# Patient Record
Sex: Female | Born: 1980 | Race: White | Hispanic: No | Marital: Married | Smoking: Never smoker
Health system: Southern US, Community
[De-identification: ages and names within clinical notes are randomized; demographics above are authoritative.]

## PROBLEM LIST (undated history)

## (undated) DIAGNOSIS — Z309 Encounter for contraceptive management, unspecified: Secondary | ICD-10-CM

## (undated) DIAGNOSIS — F99 Mental disorder, not otherwise specified: Secondary | ICD-10-CM

## (undated) HISTORY — PX: APPENDECTOMY: SHX54

## (undated) HISTORY — DX: Mental disorder, not otherwise specified: F99

## (undated) HISTORY — DX: Encounter for contraceptive management, unspecified: Z30.9

---

## 1999-03-27 ENCOUNTER — Encounter: Admission: RE | Admit: 1999-03-27 | Discharge: 1999-06-25 | Payer: Self-pay

## 2001-11-13 ENCOUNTER — Ambulatory Visit (HOSPITAL_COMMUNITY): Admission: RE | Admit: 2001-11-13 | Discharge: 2001-11-13 | Payer: Self-pay | Admitting: *Deleted

## 2002-01-15 ENCOUNTER — Encounter: Payer: Self-pay | Admitting: Pediatrics

## 2002-01-15 ENCOUNTER — Ambulatory Visit (HOSPITAL_COMMUNITY): Admission: RE | Admit: 2002-01-15 | Discharge: 2002-01-15 | Payer: Self-pay | Admitting: Pediatrics

## 2002-04-15 ENCOUNTER — Other Ambulatory Visit: Admission: RE | Admit: 2002-04-15 | Discharge: 2002-04-15 | Payer: Self-pay | Admitting: *Deleted

## 2002-10-18 ENCOUNTER — Other Ambulatory Visit: Admission: RE | Admit: 2002-10-18 | Discharge: 2002-10-18 | Payer: Self-pay | Admitting: *Deleted

## 2003-03-02 ENCOUNTER — Other Ambulatory Visit: Admission: RE | Admit: 2003-03-02 | Discharge: 2003-03-02 | Payer: Self-pay | Admitting: *Deleted

## 2003-09-22 ENCOUNTER — Other Ambulatory Visit: Admission: RE | Admit: 2003-09-22 | Discharge: 2003-09-22 | Payer: Self-pay | Admitting: Obstetrics and Gynecology

## 2004-06-24 ENCOUNTER — Emergency Department (HOSPITAL_COMMUNITY): Admission: EM | Admit: 2004-06-24 | Discharge: 2004-06-24 | Payer: Self-pay | Admitting: Emergency Medicine

## 2004-06-25 ENCOUNTER — Ambulatory Visit: Payer: Self-pay | Admitting: Psychiatry

## 2004-06-25 ENCOUNTER — Inpatient Hospital Stay (HOSPITAL_COMMUNITY): Admission: RE | Admit: 2004-06-25 | Discharge: 2004-06-26 | Payer: Self-pay | Admitting: Psychiatry

## 2005-11-30 ENCOUNTER — Inpatient Hospital Stay (HOSPITAL_COMMUNITY): Admission: AD | Admit: 2005-11-30 | Discharge: 2005-12-03 | Payer: Self-pay | Admitting: *Deleted

## 2005-12-01 ENCOUNTER — Encounter (INDEPENDENT_AMBULATORY_CARE_PROVIDER_SITE_OTHER): Payer: Self-pay | Admitting: Specialist

## 2008-03-02 ENCOUNTER — Other Ambulatory Visit: Admission: RE | Admit: 2008-03-02 | Discharge: 2008-03-02 | Payer: Self-pay | Admitting: Obstetrics and Gynecology

## 2008-04-12 ENCOUNTER — Ambulatory Visit (HOSPITAL_COMMUNITY): Admission: RE | Admit: 2008-04-12 | Discharge: 2008-04-12 | Payer: Self-pay | Admitting: Obstetrics & Gynecology

## 2008-04-28 ENCOUNTER — Ambulatory Visit (HOSPITAL_COMMUNITY): Admission: RE | Admit: 2008-04-28 | Discharge: 2008-04-28 | Payer: Self-pay | Admitting: Obstetrics & Gynecology

## 2008-06-30 ENCOUNTER — Inpatient Hospital Stay (HOSPITAL_COMMUNITY): Admission: AD | Admit: 2008-06-30 | Discharge: 2008-07-02 | Payer: Self-pay | Admitting: Obstetrics & Gynecology

## 2008-07-01 ENCOUNTER — Encounter (INDEPENDENT_AMBULATORY_CARE_PROVIDER_SITE_OTHER): Payer: Self-pay | Admitting: General Surgery

## 2008-09-28 ENCOUNTER — Inpatient Hospital Stay (HOSPITAL_COMMUNITY): Admission: RE | Admit: 2008-09-28 | Discharge: 2008-09-30 | Payer: Self-pay | Admitting: Obstetrics and Gynecology

## 2009-05-25 ENCOUNTER — Other Ambulatory Visit: Admission: RE | Admit: 2009-05-25 | Discharge: 2009-05-25 | Payer: Self-pay | Admitting: Obstetrics and Gynecology

## 2009-08-09 IMAGING — CT CT PELVIS W/ CM
3 of 4 series · 14 of 32 positions shown, 19 images · IV contrast (agent unspecified)
Comparison: Ultrasound same date.

CT ABDOMEN

CLINICAL DATA: Right lower quadrant pain.  Nausea.

CT ABDOMEN AND PELVIS WITH CONTRAST
TECHNIQUE: Multidetector CT imaging of the abdomen and pelvis was
performed following the standard protocol following the bolus
administration of intravenous contrast.
Contrast: 150 ml Smnipaque-IRR

[Series 2: abd pelvis · axial · 0.82mm/px · z∈[-434,-134]mm · 4 of 101 slices shown, 9 images]
[im 21/101  soft-tissue]
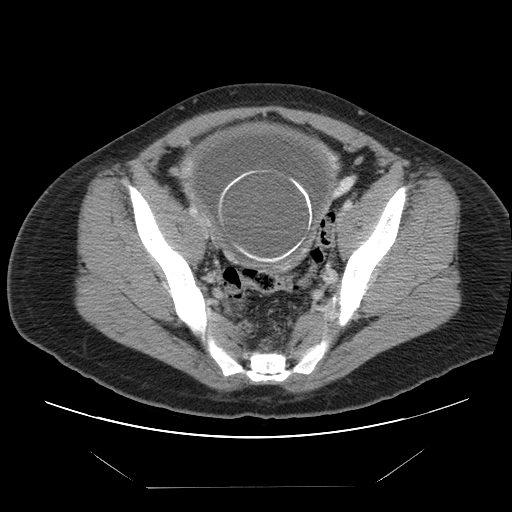
[im 21/101  lung]
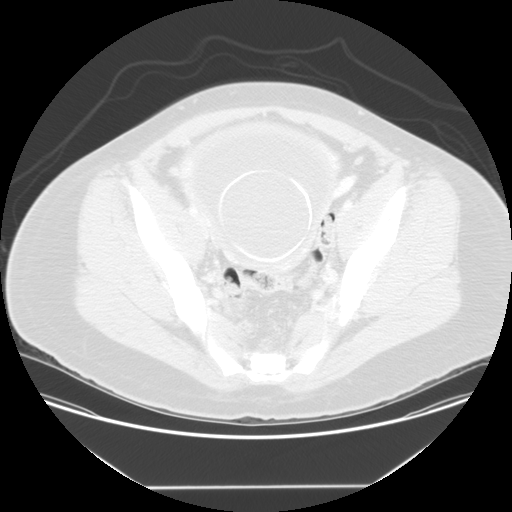
[im 21/101  bone]
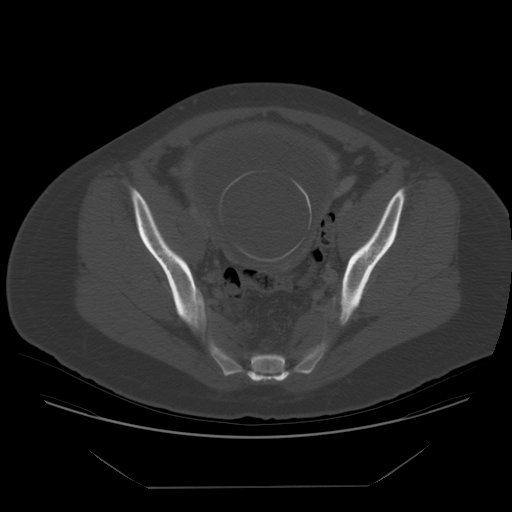
[im 41/101  soft-tissue]
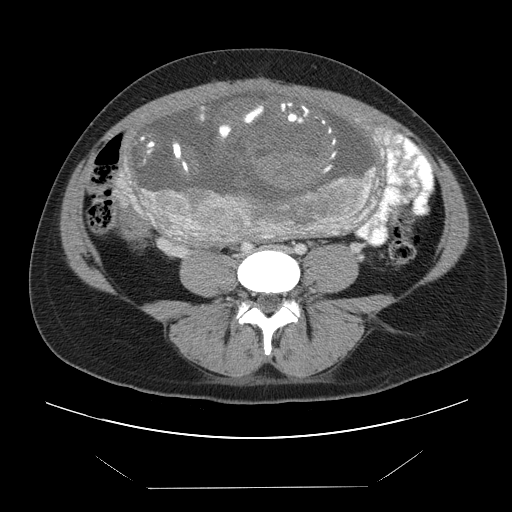
[im 41/101  lung]
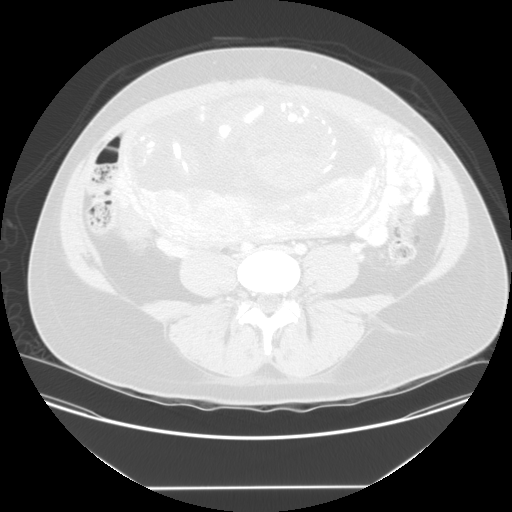
[im 61/101  soft-tissue]
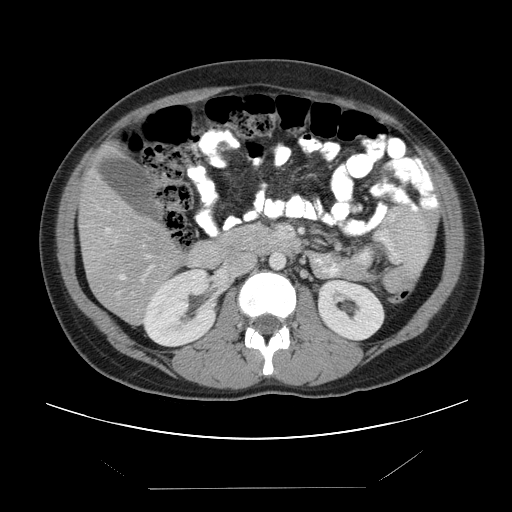
[im 61/101  lung]
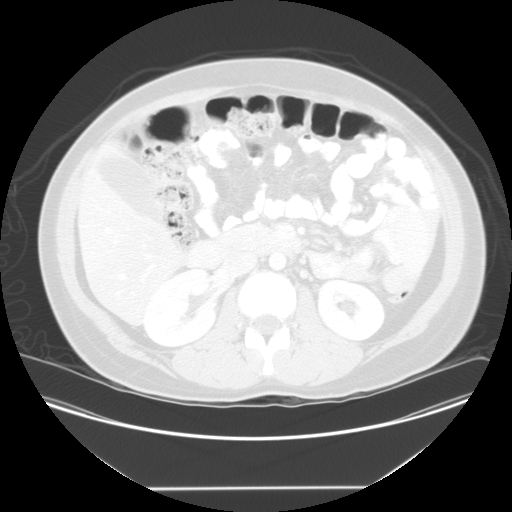
[im 81/101  soft-tissue]
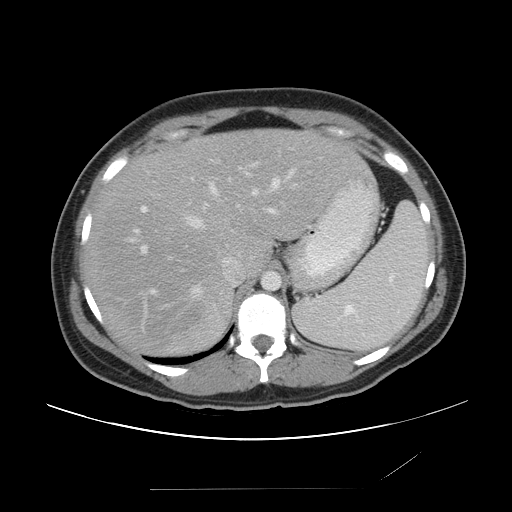
[im 81/101  lung]
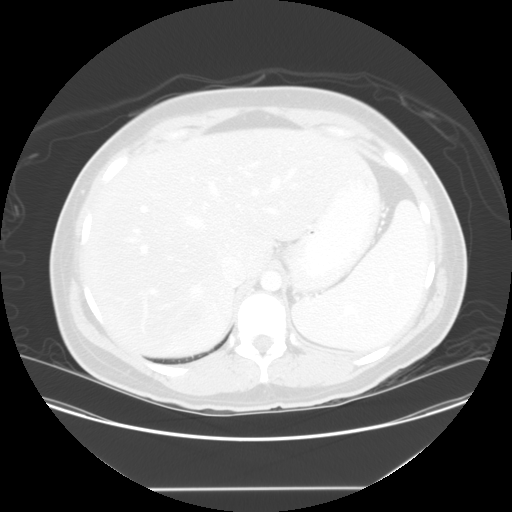

[Series 400: reformatted · coronal · 0.98mm/px · 2 of 153 slices shown (1 of 2)]
[im 16/153  soft-tissue]
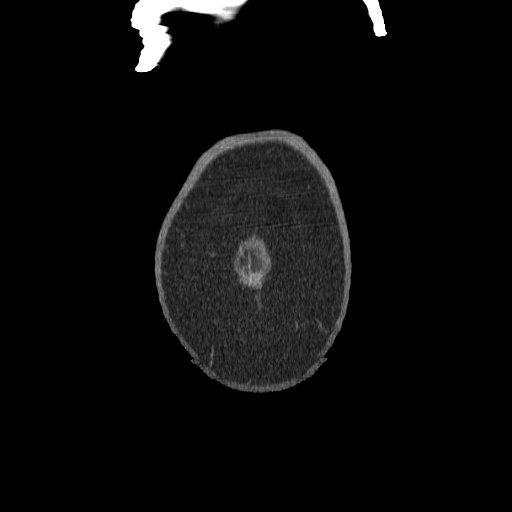
[im 31/153  soft-tissue]
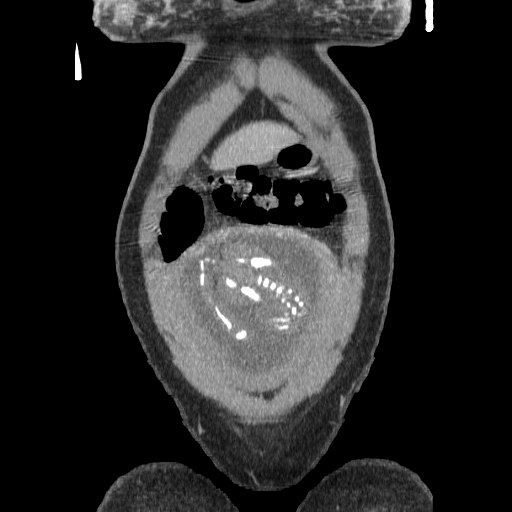

[Series 401: reformatted · sagittal · 0.98mm/px · 8 of 182 slices shown (2 of 2)]
[im 16/182  soft-tissue]
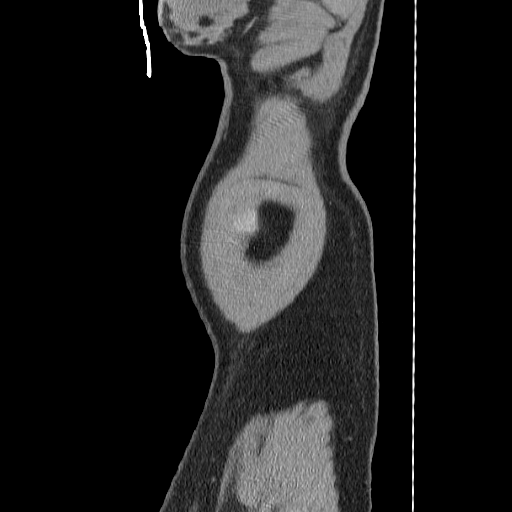
[im 46/182  soft-tissue]
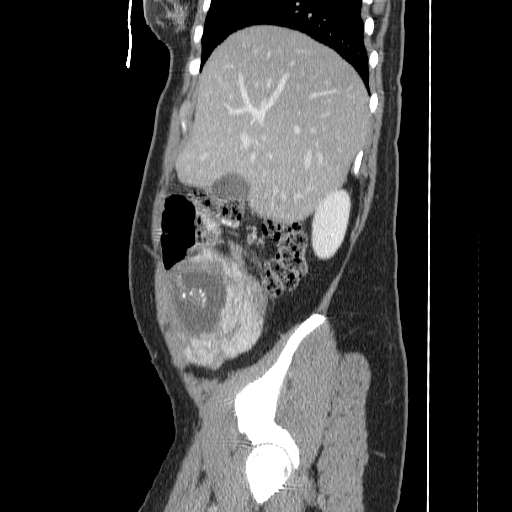
[im 61/182  soft-tissue]
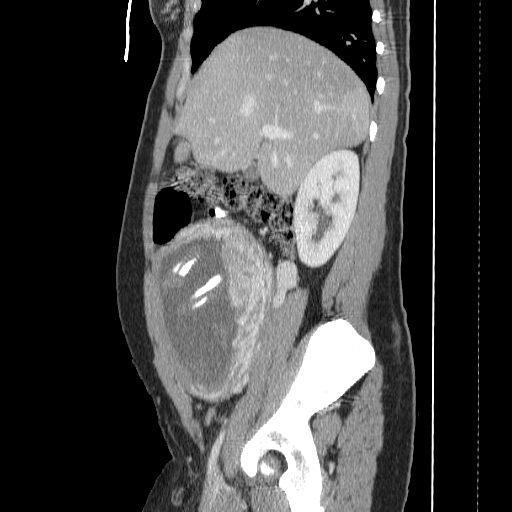
[im 76/182  soft-tissue]
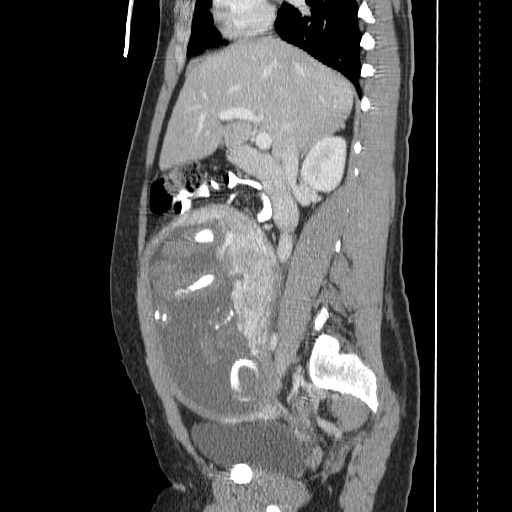
[im 106/182  soft-tissue]
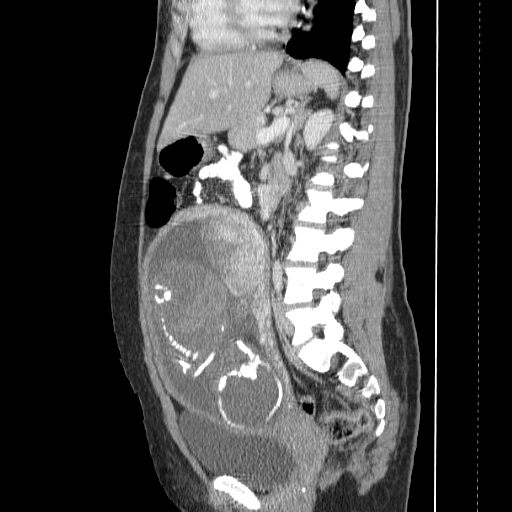
[im 121/182  soft-tissue]
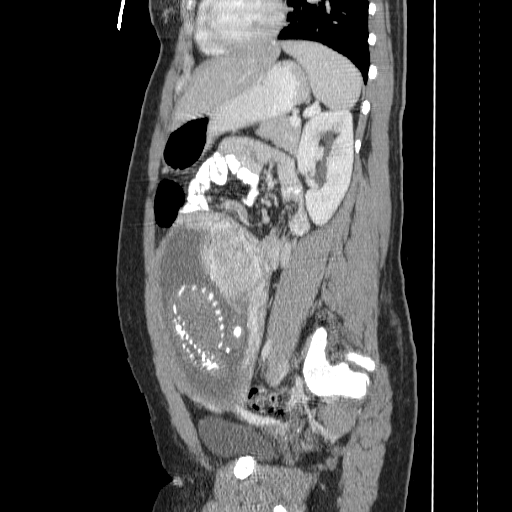
[im 136/182  soft-tissue]
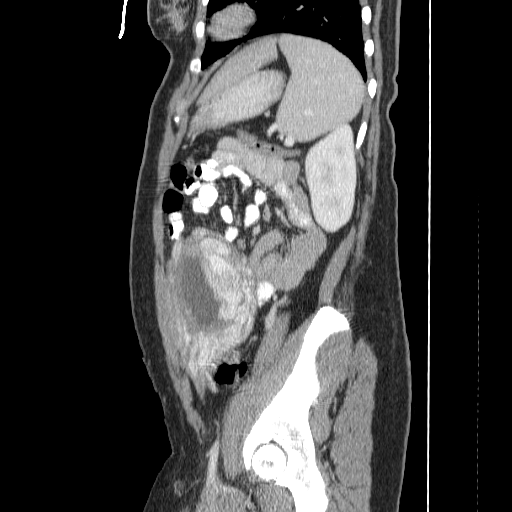
[im 166/182  soft-tissue]
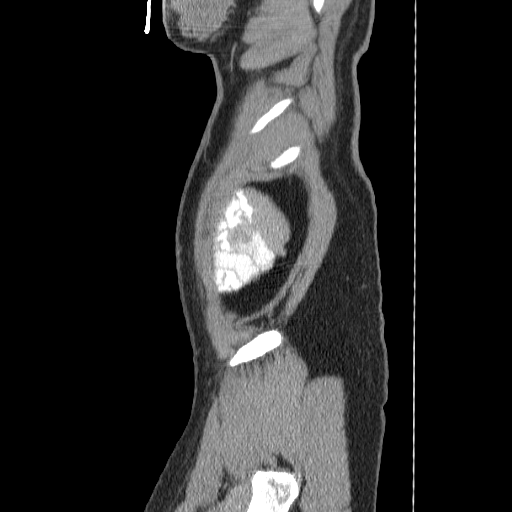

[14 of 32 positions shown; findings below may reference images not displayed]

FINDINGS: Clear lung bases.  Normal heart size without pericardial
or pleural effusion.

Tiny hiatal hernia.  Fatty infiltration the liver with probable fat
sparing peripherally and in posterior segment right lobe.

Splenomegaly at 16 cm cranial caudal.

Normal stomach, pancreas, gallbladder, biliary tract, adrenal
glands.  Physiologic caliectasis bilaterally. No retroperitoneal or
retrocrural adenopathy. Normal colon.  The appendix is felt to
originate in the right mid abdomen on image 47 is enlarged at 9 mm
with mild surrounding edema.  Image 53 series 2.  Terminates just
above the right ovary.  No evidence of rupture. Normal abdominal
small bowel without ascites.
IMPRESSION: 1.  Findings suspicious for early uncomplicated appendicitis.
2.  Fatty infiltration of the liver.
3.  Splenomegaly.

I called and personally discussed this report with Levey, Yuniel at

CT PELVIS
FINDINGS: Normal pelvic bowel loops.  No pelvic adenopathy.
Placenta positioned posteriorly.  Cephalic presentation.  Normal
urinary bladder.  No adnexal mass.
IMPRESSION: 1. No acute pelvic process.  .

## 2010-08-28 ENCOUNTER — Other Ambulatory Visit
Admission: RE | Admit: 2010-08-28 | Discharge: 2010-08-28 | Payer: Self-pay | Source: Home / Self Care | Admitting: Obstetrics & Gynecology

## 2010-12-31 LAB — CBC
HCT: 39 % (ref 36.0–46.0)
HCT: 39.6 % (ref 36.0–46.0)
MCHC: 34.4 g/dL (ref 30.0–36.0)
Platelets: 139 10*3/uL — ABNORMAL LOW (ref 150–400)
RBC: 4.01 MIL/uL (ref 3.87–5.11)
WBC: 10.9 10*3/uL — ABNORMAL HIGH (ref 4.0–10.5)
WBC: 9.8 10*3/uL (ref 4.0–10.5)

## 2010-12-31 LAB — GLUCOSE, CAPILLARY
Glucose-Capillary: 73 mg/dL (ref 70–99)
Glucose-Capillary: 86 mg/dL (ref 70–99)
Glucose-Capillary: 89 mg/dL (ref 70–99)

## 2011-01-29 NOTE — Consult Note (Signed)
NAMELADON, [name]             ACCOUNT NO.:  [id number]   MEDICAL RECORD NO.:  [id number]          PATIENT TYPE:  MAT   LOCATION:  MATC                          [facility]ACILITY:  [facility]   PHYSICIAN:  Almond Lint, MD       DATE OF BIRTH:  Jan 30, 1981   DATE OF CONSULTATION:  07/01/2008  DATE OF DISCHARGE:                                 CONSULTATION   CHIEF COMPLAINT:  Abdominal pain.   HISTORY OF PRESENT ILLNESS:  [name] is a 30 year old female who  presents with around 18 hours of periumbilical abdominal pain migrating  to the right lower quadrant.  She states that it started as menstrual-  like cramps, but then gradually got worse, and became stabbing pain on  the right side of her abdomen.  It has been progressively worsening over  the day.  It is more painful with moving.  She has had anorexia  throughout the day with nausea and vomiting.  No diarrhea or  constipation.   PAST MEDICAL HISTORY:  Significant for depression.  She is [redacted] weeks  pregnant.  She is a G2, P1.   PAST SURGICAL HISTORY:  Significant for no intra-abdominal surgeries.   MEDICATIONS:  Celexa and prenatal vitamins.   DRUG ALLERGIES:  None.   FAMILY HISTORY:  MI in her father.   SOCIAL HISTORY:  She is a accompanied by her husband and is a nonsmoker  and nondrinker and does not use drugs.  She is a third grade PE teacher.   REVIEW OF SYSTEMS:  Otherwise negative x11 systems.   PHYSICAL EXAMINATION:  VITAL SIGNS:  Temperature is 97.9, pulse 77,  blood pressure 115/66, and respiratory rate 20.  GENERAL:  She is alert and oriented x3, in no acute distress.  HEENT:  Normocephalic, atraumatic.  Pupils equal, round, and reactive to  light.  NECK:  Supple with no lymphadenopathy.  No thyromegaly.  Trachea is  midline.  HEART:  Regular.  ABDOMEN:  Soft, gravid, nondistended other than the pregnant uterus.  There is tenderness in the right lower quadrant.  No rebound or  guarding.  No Rovsing sign.   LABS:   The patient's white count is 14.6.  Electrolytes are within  normal limits including LFTs and UA is within normal limits.  CT scan  was performed, which shows positive for early acute appendicitis.  CT  scan images are reviewed as well as the report.  These were consistent  with acute appendicitis.   ASSESSMENT:  [name] is a 31 year old female who is [redacted] weeks pregnant  with early appendicitis.  We will take her to the operating room and  perform a laparoscopic possible open appendectomy.  Risks and benefits  of the  surgery were discussed with the patient including the risk of preterm  labor versus the risk of rupture with peritonitis.  She also was  described the risk of bleeding, infection, damage to other adjacent  structures.  She understands and wishes to proceed.  She will remain  under the care of the obstetrician postoperatively at [facility].      [place]  Donell Beers, MD  Electronically Signed     FB/MEDQ  D:  07/01/2008  T:  07/01/2008  Job:  [id number]

## 2011-01-29 NOTE — H&P (Signed)
NAMEANTONYA, [name]             ACCOUNT NO.:  [id number]   MEDICAL RECORD NO.:  [id number]         PATIENT TYPE:  WINPP   LOCATION:                                [facility]   PHYSICIAN:  Tilda Burrow, M.D. DATE OF BIRTH:  05-26-1981   DATE OF ADMISSION:  09/28/2008  DATE OF DISCHARGE:                              HISTORY & PHYSICAL   ADMITTING DIAGNOSES:  1. Pregnancy 40 weeks 1 day.  2. Borderline glucose tolerance.  3. Large for gestational age infant, estimated fetal weight 4278 g on      September 20, 2008.   HISTORY OF PRESENT ILLNESS:  This is a 30 year old female, gravida 2,  para 1-0-0-1, status post prior delivery of an 8 pound 9 ounce infant at  33 weeks' gestation at [facility].  She was admitted at this time  for induction of labor.  We are going to treat her like a diet-  controlled gestational diabetic in good control and deliver at 40 weeks.  She has been followed through [facility] this pregnancy and has  had pregnancy notable for 1-hour glucose tolerance test of 160 mg  percent at 28 weeks with retest 3-hour glucose tolerance test results of  91/182/125/80, one abnormal value with repeated 3-hour glucose tolerance  test on August 29, 2008, at 35 weeks 6 days, which was normal  (82/154/150/93).  She has had no glucosuria in the pregnancy until her  39 weeks 3-day visit on September 25, 2008, when she had marked glucosuria  and a 2-hour postprandial blood sugar of 145.  Fundal height  examinations have been within normal limits, but estimated fetal weight  in the interim was 4278 g on September 20, 2008.   OBSTETRICAL HISTORY:  Significant in that the patient today describes  that she was told that her last baby's head was caught beneath the  pubic bone.  One of her family members thought that they were told that  the shoulder had been impacted.  I have reviewed the labor summary and  Dr. Kermit Balo old records and it says that the Apgars were 8 and  9 and  does not mention the word shoulder dystocia on the delivery summary.  Nonetheless, we will monitor closely for possibility of shoulder  dystocia with this delivery.  The patient is aware of the risk of  shoulder dystocia and the potential risk of difficult deliveries.  Further information about her last delivery is that she was completely  dilated approximately 3 a.m., was allowed to labor down for over an hour  and then pushed for 1 hour, and delivered precisely 1 hour of pushing.  The epidural was allowed to ware off to allow her to be more effective  pusher.   PAST MEDICAL HISTORY:  Positive for mild asthma and anxiety.   GYN HISTORY:  Notable for cervical dysplasia, treated with laser  conization of the cervix in 2003 prior to her first baby, which was born  in 2007.  She has no allergies.  This pregnancy has also been notable  for early appendicitis, treated with laparoscopic appendectomy on  July 01, 2008, with excellent recovery.   MEDICATIONS:  Prenatal vitamins only.  She used Effexor XR 225 mg before  pregnancy and stopped it.   SOCIAL HISTORY:  She is married, Engineer, site, [employer], [employer]  at [employer].  She plans epidural, understands and supports the  idea of letting it wear off this time to allow her to push.   ADDITIONAL LABS:  Blood type A+.  Rubella immunity present, varicella  immunity present, antibody screen negative.  Hemoglobin 15, hematocrit  45, and platelets 223.  Hepatitis, HIV, RPR, GC and chlamydia all  negative.  Down syndrome risk was noted to have an increased trisomy 18  risk of 1-2, but genetic ultrasound was completely normal, through  [facility].  She plans breast feed, plans condoms in  the future.   PHYSICAL EXAMINATION:  On September 27, 2008:  VITAL SIGNS:  Height 5 feet 11 inches, weight 228.5, and blood pressure  138/90.  HEENT:  Pupils equal, round and reactive.  NECK:  Supple.   CARDIOVASCULAR:  Unremarkable.  ABDOMEN:  38-cm fundal height.  Estimated fetal weight 4278 g by recent  ultrasound 1 week ago, but does not seem particularly large on clinical  exam.  PELVIS:  Cervix is 2-3 cm, 50%, and -2 vertex.  Pelvis _adequate  clinically_.   IMPRESSION:  1. Pregnancy 40 weeks 1 day.  2. Borderline normal glucose tolerance testing.  3. History of large for gestational age infant with possible history      of shoulder dystocia.   PLAN:  We will bring the patient in on September 28, 2008, if she does not  go under labor tonight, she is having contractions every 10 minutes when  nonstress test performed on September 27, 2008.  NST is reactive.      Tilda Burrow, M.D.  Electronically Signed     JVF/MEDQ  D:  09/27/2008  T:  09/28/2008  Job:  [id number]

## 2011-01-29 NOTE — Discharge Summary (Signed)
[name], [name]             ACCOUNT NO.:  [id number]   MEDICAL RECORD NO.:  [id number]          PATIENT TYPE:  INP   LOCATION:  9303                          [facility]  [facility]   PHYSICIAN:  Tilda Burrow, M.D. DATE OF BIRTH:  Aug 28, 1981   DATE OF ADMISSION:  06/30/2008  DATE OF DISCHARGE:  07/02/2008                               DISCHARGE SUMMARY   ADMITTING DIAGNOSES:  1. Pregnancy, [redacted] weeks gestation.  2. Right lower quadrant pain.  3. Rule out appendicitis.  4. Rule out preterm labor.   DISCHARGE DIAGNOSES:  1. Pregnancy, 27 weeks, not delivered.  2. Acute suppurative appendicitis.   PROCEDURE:  Laparoscopic appendectomy by Dr. Fredric Mare, October, 16, 2009,  at 3 a.m.   DISCHARGE MEDICATIONS:  Vicodin 5/500 mg one q.6 h. p.r.n. pain.   Preterm labor warning instructions reviewed.  Followup, 3-5 days at  [facility].   A 30 year old female was seen in [facility] on June 30, 2008,  complaining of abdominal pain around mid day, that is getting sharp and  stabbing in the right lower quadrant, getting worse with moving and with  some sittings and standings.  Some Tylenol did not relieve it.  No  vaginal discharge or bleeding.  She had some pressure when urinating.  No frequency, urgency, or dysuria.  She was sent to [facility]  for evaluation.  She is gravid 2, para 1-0-0-1 at 27.[redacted] weeks gestation.  On arrival, temperature was 97.9, pulse 77, and respiration is 20.   HOSPITAL COURSE:  Notable for the patient, examined with persistent  right upper quadrant tenderness.  Consideration of appendicitis may be  based on physical exam.  White count is 14,000, hemoglobin 13, and  hematocrit 38.  A CT of the abdomen, which was positive for early  uncomplicated appendicitis.  The plan was to transfer to [facility] for  surgery and then return to [facility].  She went to [facility],  had the surgery and came back, did well and was  tolerating regular diet.  The afternoon after surgery with resolution of abdominal pain.  She had  external monitoring showed no contractions.  On the following morning,  she remained  afebrile with additional monitoring showing no evidence of urinary  irritability.  Signs and symptoms of preterm labor were reviewed and the  patient was discharged to home for follow up in 3-5 days at [facility] to resume care.      Tilda Burrow, M.D.  Electronically Signed     JVF/MEDQ  D:  07/02/2008  T:  07/02/2008  Job:  [id number]   cc:   Marlinda Mike, C.N.M.

## 2011-01-29 NOTE — Op Note (Signed)
NAMEKIMMIE, BERGGREN             ACCOUNT NO.:  [id number]   MEDICAL RECORD NO.:  [id number]          PATIENT TYPE:  MAT   LOCATION:  MATC                          [facility]:  [facility]   PHYSICIAN:  Almond Lint, MD       DATE OF BIRTH:  [birth year]   DATE OF PROCEDURE:  DATE OF DISCHARGE:                               OPERATIVE REPORT   PREOPERATIVE DIAGNOSIS:  Appendicitis.   POSTOPERATIVE DIAGNOSIS:  Appendicitis.   PROCEDURE:  Laparoscopic appendectomy.   SURGEON:  Almond Lint, MD   ASSISTANT:  Leonie Man, MD   ANESTHESIA:  General and local.   FINDINGS:  Acute suppurative appendicitis, nonperforated, nongangrenous.   SPECIMENS:  Appendix to pathology.   ESTIMATED BLOOD LOSS:  Minimal.   COMPLICATIONS:  None.   PROCEDURE:  Ms. Yusuf was identified in the holding area and taken to  the operating room where she was placed supine on the operating room  table.  General endotracheal anesthesia was induced.  Foley catheter was  placed and arms were tucked.  The patient's abdomen was prepped and  draped in a sterile fashion.  The time out was performed according to  the surgical safety checklist.  The supraumbilical skin was anesthetized  with local anesthesia and the skin incised vertically with a #11 blade.  The subcutaneous tissue was divided bluntly with Tresa Endo.  The umbilical  stalk was elevated with a Kocher clamp, then, an additional Kocher clamp  was placed on each side of the midline.  The fascia was entered sharply  with the #11 blade.  The 0 Vicryl was placed in pursestring fashion  around the fascial incision.  Hasson trocar was introduced into the  abdomen and pneumoperitoneum was achieved to a pressure of 15 mmHg.  The  camera was introduced into the abdomen and the patient was put in  Trendelenburg and rotated to the left.  The cecum was identified.  A  trocar was placed in the left lower quadrant just to the left of midline  infraumbilically under direct  visualization after administration of  local.  A 5 mm port was placed very carefully taking care not to disturb  the uterus.  A Kingsley Spittle was used to pull up the cecum and identified the  appendix in an anterior location.  The appendix was seen immediately to  be inflamed.  An additional port was placed in similar fashion under  direct visualization in the right upper quadrant.  The appendix was  grasped and elevated with a locking grasper.  The harmonic scalpel was  then used to takedown the mesoappendix until the base of the appendix  was seen at the cecum.  Care was taken not to injure the uterus of the  fimbriae or ovary.  Additionally, the terminal ileum was avoided.  Once  the appendiceal base was skeletonized, the camera was switched to a 5-mm  camera and the Endo-GIA was fired across the base of the appendix.  The  appendix was then placed in the EndoCatch bag and removed through the  umbilical incision without difficulty.  The appendiceal stump was then  examined and was seen to have no bleeding.  There was no purulent  drainage in the pelvis or the right lower quadrant.  The right upper  quadrant and left lower quadrant ports were removed under direct  visualization.  There was no bleeding seen.  The pneumoperitoneum was  then evacuated through the Hasson trocar and then the Hasson was  removed.  The pursestring suture was closed and there were no fascial  defects at the umbilicus.  The skin was closed with 4-0 Monocryl in a  subcuticular fashion.  The wounds were then cleaned, dried, and dressed  with Dermabond.  The patient was awakened from anesthesia and taken to  PACU in stable condition.      Almond Lint, MD  Electronically Signed     FB/MEDQ  D:  07/01/2008  T:  07/01/2008  Job:  [id number]

## 2011-01-29 NOTE — Op Note (Signed)
[name], [name]             ACCOUNT NO.:  [id number]   MEDICAL RECORD NO.:  [id number]          PATIENT TYPE:  INP   LOCATION:  9101                          [facility]   PHYSICIAN:  Tilda Burrow, M.D. DATE OF BIRTH:  01/06/1981   DATE OF PROCEDURE:  09/28/2008  DATE OF DISCHARGE:                               OPERATIVE REPORT   DELIVERY TIME:  7 p.m.   Casmira progressed in labor, moving nicely to 7-8 cm at 5 o'clock to  completely dilated 1 hour later.  Second stage was brief less than 15  minutes with delivery of 3990 g (8 pounds 13 ounces) female infant over  an intact perineum with small second-degree laceration occurring.  Vertex was in the right occiput anterior position.  The left shoulder  was unaccessible below the symphysis pubis.   There was nuchal cord x1, which was easily released.  Posterior shoulder  was easily accessible and with the patient in McRoberts position.  An  easy clockwise rotation of the infant was performed releasing the right  arm completely after 180 degree rotation.  The baby had good tone  initially with heart rate approximately 100 and was placed on maternal  abdomen and was stimulated.  There was decreased respiratory activity  despite that, so the baby was taken to warmer with heart rate noted in  the 80s requiring bag mask ventilation for less than 30 seconds with  good response in heart rate and color.  Neonatal ICU evaluation was  performed just as a precaution, baby was left in the room.  Assessing  the baby, identified that the baby's abdominal girth was greater than  10, the chest and hip proportions.  She was taken to nursery for  additional monitoring where the abdominal x-ray was performed, which did  not identify any abnormalities with the diaphragm.  Baby otherwise is  fine and was taken to mother shortly thereafter.   Delivery of the placenta had been easily performed with 350 mL blood  loss.  The laceration was  repaired with a single layer of running 3-0  Vicryl.      Tilda Burrow, M.D.  Electronically Signed     JVF/MEDQ  D:  09/29/2008  T:  09/29/2008  Job:  [id number]   cc:   Lorin Picket A. Gerda Diss, MD  Fax: [phone]

## 2011-02-01 NOTE — H&P (Signed)
[facility]  Patient:    [name], [name] Visit Number: [id number] MRN: [id number]          Service Type: Attending:  Marina Gravel, M.D. Dictated by:   Marina Gravel, M.D. Adm. Date:  11/13/01   CC:         Dr. Cleta Alberts   History and Physical  PREOPERATIVE DIAGNOSIS:       Recurrent cervical dysplasia.  INTENDED PROCEDURE:           CO2 laser vaporization of cervical dysplasia.  HISTORY OF PRESENT ILLNESS:   A 30 year old white female gravida 0 recently seen at the request of Dr. Cleta Alberts for evaluation of abnormal Pap.  History of LEEP April 2002.  Subsequent Pap smear August 31, 2001 showed low-grade SIL. After that, colposcopy was performed and colposcopically directed biopsy showed CIN 2 in a small area of acetowhite epithelium near the cervical canal. Given the recurrent versus persistent nature of the dysplasia and the fact that it is a small area that appears to be abnormal, I have recommended that the patient proceed with laser vaporization as opposed to a repeat LEEP to reduce cervical trauma.  PAST MEDICAL HISTORY:         Asthma, no recent symptoms.  SURGICAL HISTORY:             Otherwise negative.  MEDICATIONS:                  Mircette.  ALLERGIES:                    None.  SOCIAL HISTORY:               Patient has recently stopped smoking.  No alcohol or other drugs.  FAMILY HISTORY:               Diabetes, heart disease, hypertension.  REVIEW OF SYSTEMS:            Otherwise noncontributory.  PHYSICAL EXAMINATION:  VITAL SIGNS:                  Blood pressure 102/68, weight 148, height 5 feet 10 inches.  GENERAL:                      The patient is well-developed, well-nourished, normal body habitus, no deformities.  NECK:                         Supple, no thyromegaly.  HEART:                        Regular rate and rhythm.  LUNGS:                        Clear to auscultation.  PELVIC:                       Shows  normal external genitalia, vagina appears normal.  Cervix grossly normal and colposcopic findings as outlined above.  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  PLAN:                         Laser vaporization of the cervix.  Operative risks discussed including infection,  bleeding, damage to surrounding organs, and small potential for effect on fertility.  All questions answered, patient wished to proceed. Dictated by:   Marina Gravel, M.D. Attending:  Marina Gravel, M.D. DD:  11/10/01 TD:  11/10/01 Job: [id number]

## 2011-02-01 NOTE — Op Note (Signed)
[facility]  Patient:    [name], CENICEROS Visit Number: [id number] MRN: [id number]          Service Type: DSU Location: [facility] Attending Physician:  Ermalene Searing Dictated by:   Marina Gravel, M.D. Proc. Date: 11/13/01 Admit Date:  11/13/2001                             Operative Report  PREOPERATIVE DIAGNOSIS:       Recurrent cervical intraepithelial neoplasia II.  POSTOPERATIVE DIAGNOSIS:      Recurrent cervical intraepithelial neoplasia II.  PROCEDURE:                    Laser vaporization of the cervix.  SURGEON:                      Marina Gravel, M.D.  ANESTHESIA:                   MAC and 10 cc 1% lidocaine with epinephrine.  FINDINGS:                     On colposcopy, dense acetowhite changes in the endocervix and mild acetowhite changes in the ectocervix in a circumferential fashion.  ESTIMATED BLOOD LOSS:         Less than 50.  COMPLICATIONS:                None.  INDICATIONS:                  Patient with a history of cervical dysplasia status post LEEP with persistent versus recurrent abnormal Pap smears. Subsequent colposcopy showed biopsy-proven CIN-II.  The patient therefore returns for retreatment.  Given that it is a fairly small lesion and the patient has already had one LEEP, I recommended laser vaporization to minimize cervical damage.  Operative risks were discussed including infection, bleeding, damage to surrounding organs and potential implications for fertility, although rare.  The patient was in agreement.  DESCRIPTION OF PROCEDURE:     The patient was taken to the operating room and MAC anesthesia was obtained.  She was placed in the ski position and colposcopy performed.  The above findings were noted.  The patient was draped with moistened green towels for laser protection.  A blackened speculum was used.  Paracervical block was placed in the standard fashion.  The laser was turned on 30 watts continuous.   The area of abnormality was outlined with laser and vaporized to a depth of 3 mm throughout.  Hemostasis was achieved by broadening the laser beam and painting the area.  Subsequently, Monsels solution was placed and hemostasis obtained.  The patient tolerated the procedure well.  There were no complications.  She was taken to the recovery room awake, alert and in stable condition. Dictated by:   Marina Gravel, M.D. Attending Physician:  Marina Gravel B DD:  11/13/01 TD:  11/13/01 Job: [id number]

## 2011-02-01 NOTE — Discharge Summary (Signed)
[name], [name]             ACCOUNT NO.:  [id number]   MEDICAL RECORD NO.:  [id number]          PATIENT TYPE:  IPS   LOCATION:  0303                          FACILITY:  [facility]   PHYSICIAN:  Jeanice Lim, M.D. DATE OF BIRTH:  1980/11/20   DATE OF ADMISSION:  06/25/2004  DATE OF DISCHARGE:  06/26/2004                                 DISCHARGE SUMMARY   IDENTIFYING INFORMATION:  This is a 30 year old white female who is married.  This is a voluntary admission.   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  She has had problems with feeling anxious and depressed with constant  worry and poor sleep since about August when she started seeing Dr. Milford Cage, her current psychiatrist.  Over the course of the past three months,  she had become increasingly anxious, being unable to concentrate at work  with constant ruminations on problems with relationships, usually at work,  always fearing that she had hurt someone's feelings, rethinking different  words that she might have said to someone, fearing that she had said the  wrong thing, that she had offended someone and constantly worrying about  others' impression of her.  She denies any rituals.  She denies any suicidal  thoughts or thoughts of harming others.  She has no history of suicidal  thoughts or suicide attempts.  She has no history of substance abuse.  She  had been started on Prozac 20 mg by her psychiatrist, approximately a month  ago and this was increased this past Thursday to 40 mg of Prozac daily.  Has  also been placed on Xanax 0.5 mg, 1-2 tablets q.i.d. for her anxiety.  She  also takes Restoril 15 mg, 1-2 tablets at bedtime.  She became increasingly  anxious about a week and a half ago when she found out that her husband  was  going to go out of town and she began to have obsessive fears of being hit  by someone else in a car while she was driving, so she had stopped driving.  She was also taken out of work for disability.  She denies any  hallucinations.   PAST PSYCHIATRIC HISTORY:  This is the patient's first psychiatric  admission.  Has been followed by Dr. Milford Cage since August of 2005.  She denies any past history of childhood abuse, of mood swings or panic  attacks.   SOCIAL HISTORY:  The patient is college educated.  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  Has no children.  Is happily married.  No legal problems.   FAMILY HISTORY:  Father with depression.   ALCOHOL/DRUG HISTORY:  The patient has no past history or current problems  with substance abuse.   MEDICAL HISTORY:  The patient is followed by Dr. Rosalio Macadamia in [place]  who  is her primary care physician.  Medical problems include asthma for  which she occasionally takes an albuterol inhaler.   MEDICATIONS:  Admitting medications were Restoril 15 mg p.o. q.h.s., Prozac  40 mg daily, Xanax 0.5 mg, 1-2 tabs q.i.d. and albuterol inhaler p.r.n.   ALLERGIES:  None.   PHYSICAL EXAMINATION:  Done on admission and was within normal limits.   LABORATORY DATA:  Within normal limits.  TSH was normal at 1.804.   MENTAL STATUS EXAM:  On admission, the patient was fully alert with bright  affect, anxious, sitting with upright posture on the edge of the chair,  frequently clenching her hands or rubbing her knees.  She is complaining of  significant exacerbation of her anxiety since being admitted here to the  unit, feels unsafe in the environment because it is unfamiliar to her.  She  is fully alert, cooperative, pleasant.  Speech is normal in pace, tone and  amount.  Mood is anxious.  Thought process logical, coherent, goal-oriented.  No evidence of suicidal thoughts or  homicidal thoughts.  No dangerous  ideation.  No signs of psychosis.  She definitely perseverates and ruminates  repeatedly on various concerns about her relationships with others, what  they think of her, has she harmed anyone or said the wrong thing.  Cognitively, she is intact and oriented x 3.  Insight is adequate.  Impulse  control and judgment within normal limits.   DIAGNOSES:   AXIS I:  Obsessive-compulsive disorder.   AXIS II:  No diagnosis.   AXIS III:  Asthma.   AXIS IV:  Some occupational stress because of her symptoms.   AXIS V:  Current 30; past year 52.   HOSPITAL COURSE:  The patient was here for one day and, during that time, we  did meet with her and evaluate her symptoms.  It was determined that,  because of her anxiety in the milieu, her mother had agreed to take her home  and ensure her safety.  The patient agrees to follow up with her outpatient  physician on Thursday.  At this time, we will make no medication changes.   DISCHARGE MEDICATIONS:  1.  Xanax 0.5 mg, 1-2 tabs q.i.d.  2.  Prozac 40 mg q.a.m.  3.  Restoril 15 mg, 1-2 tabs q.h.s.  4.  Albuterol inhaler as needed for asthma.   DISCHARGE DIAGNOSES:   AXIS I:  Obsessive-compulsive disorder   AXIS II:  No diagnosis.   AXIS III:  Asthma.   AXIS IV:  Moderate (occupational stress, currently on leave of absence  because of symptoms of obsessive-compulsive disorder).   AXIS V:  Current 55; past year 62.   DISPOSITION:  The patient was discharged home with parents.     Marg   MAS/MEDQ  D:  06/26/2004  T:  06/26/2004  Job:  [id number]

## 2011-06-17 LAB — URINALYSIS, ROUTINE W REFLEX MICROSCOPIC
Glucose, UA: NEGATIVE
Nitrite: NEGATIVE
Protein, ur: NEGATIVE
Urobilinogen, UA: 0.2

## 2011-06-17 LAB — COMPREHENSIVE METABOLIC PANEL
AST: 18
Alkaline Phosphatase: 42
BUN: 3 — ABNORMAL LOW
Calcium: 8.8
Chloride: 104
Creatinine, Ser: 0.5
Glucose, Bld: 89
Potassium: 3.6
Total Protein: 6.4

## 2011-06-17 LAB — WET PREP, GENITAL
Clue Cells Wet Prep HPF POC: NONE SEEN
Trich, Wet Prep: NONE SEEN
Yeast Wet Prep HPF POC: NONE SEEN

## 2011-06-17 LAB — DIFFERENTIAL
Basophils Absolute: 0.1
Eosinophils Absolute: 0.1
Lymphocytes Relative: 15
Neutrophils Relative %: 79 — ABNORMAL HIGH

## 2011-06-17 LAB — GC/CHLAMYDIA PROBE AMP, GENITAL
Chlamydia, DNA Probe: NEGATIVE
GC Probe Amp, Genital: NEGATIVE

## 2011-06-17 LAB — CBC: MCV: 96.4

## 2011-09-03 ENCOUNTER — Other Ambulatory Visit (HOSPITAL_COMMUNITY)
Admission: RE | Admit: 2011-09-03 | Discharge: 2011-09-03 | Disposition: A | Discharge status: Home / Self Care | Payer: BC Managed Care – PPO | Source: Ambulatory Visit | Attending: Obstetrics & Gynecology | Admitting: Obstetrics & Gynecology

## 2011-09-03 ENCOUNTER — Other Ambulatory Visit: Payer: Self-pay | Admitting: Obstetrics & Gynecology

## 2011-09-03 DIAGNOSIS — Z01419 Encounter for gynecological examination (general) (routine) without abnormal findings: Secondary | ICD-10-CM | POA: Insufficient documentation

## 2013-03-15 ENCOUNTER — Other Ambulatory Visit: Payer: Self-pay | Admitting: *Deleted

## 2013-03-15 MED ORDER — VENLAFAXINE HCL ER 75 MG PO CP24: ORAL_CAPSULE | ORAL | Status: DC

## 2013-04-16 ENCOUNTER — Other Ambulatory Visit: Payer: Self-pay | Admitting: *Deleted

## 2013-04-16 MED ORDER — VENLAFAXINE HCL ER 75 MG PO CP24: ORAL_CAPSULE | ORAL | Status: DC

## 2013-05-19 ENCOUNTER — Other Ambulatory Visit: Payer: Self-pay | Admitting: *Deleted

## 2013-05-19 MED ORDER — VENLAFAXINE HCL ER 75 MG PO CP24: ORAL_CAPSULE | ORAL | Status: DC

## 2013-06-18 ENCOUNTER — Telehealth: Payer: Self-pay | Admitting: Family Medicine

## 2013-06-18 MED ORDER — VENLAFAXINE HCL ER 75 MG PO CP24: ORAL_CAPSULE | ORAL | Status: DC

## 2013-06-18 NOTE — Telephone Encounter (Signed)
Patient needs Rx for generic effexor    [facility]

## 2013-06-18 NOTE — Telephone Encounter (Signed)
Patient scheduled office visit. Two week Rx sent electronically to the pharmacy to get her thru till her appt. Patient aware.

## 2013-06-29 ENCOUNTER — Ambulatory Visit (INDEPENDENT_AMBULATORY_CARE_PROVIDER_SITE_OTHER): Payer: BC Managed Care – PPO | Admitting: Family Medicine

## 2013-06-29 ENCOUNTER — Encounter: Payer: Self-pay | Admitting: Family Medicine

## 2013-06-29 VITALS — BP 118/68 | Ht 71.0 in | Wt 189.6 lb

## 2013-06-29 DIAGNOSIS — F329 Major depressive disorder, single episode, unspecified: Secondary | ICD-10-CM

## 2013-06-29 NOTE — Progress Notes (Signed)
  Subjective:    Patient ID: [name], female    DOB: June 29, 1981, 32 y.o.   MRN: [id number]  HPI Patient is here today for a refill on her medication.  She would like to talk about either increasing the dose or about a different medication.   Mentally a very tough year. One thing after the other  Feeling very stressed.  Really down for awhile.  Just a struggle, now significan t external stress  Outlet of exercise--ran the marathon.   night sleep a trouble--not good,  Review of Systems No chest pain no back pain no headaches diminished energy ROS otherwise negative.    Objective:   Physical Exam  Alert no acute distress. Vital stable. Lungs clear. Heart regular in rhythm.      Assessment & Plan:  Impression 1 insomnia discussed. #2 anxiety intermittent in nature. #3 depression ongoing. #4 fatigue likely related to one 2 and 3. Plan 25 minutes spent most in discussion. Increase Effexor to 2 twice a day. Consultation rationale discussed. WSL

## 2013-07-02 ENCOUNTER — Telehealth: Payer: Self-pay | Admitting: Family Medicine

## 2013-07-02 ENCOUNTER — Other Ambulatory Visit: Payer: Self-pay

## 2013-07-02 MED ORDER — VENLAFAXINE HCL ER 75 MG PO CP24: ORAL_CAPSULE | ORAL | Status: DC

## 2013-07-02 NOTE — Telephone Encounter (Signed)
Patient was supposed to have generic effexor called in when she came in for appointment on 06/29/13, but pharmacy states they did not receive it.    [facility]

## 2013-07-02 NOTE — Telephone Encounter (Signed)
Medication was sent in to pharmacy. Patient was notified.  

## 2013-07-05 ENCOUNTER — Other Ambulatory Visit: Payer: Self-pay | Admitting: Adult Health

## 2013-07-23 ENCOUNTER — Ambulatory Visit (INDEPENDENT_AMBULATORY_CARE_PROVIDER_SITE_OTHER): Payer: BC Managed Care – PPO | Admitting: Nurse Practitioner

## 2013-07-23 ENCOUNTER — Encounter: Payer: Self-pay | Admitting: Nurse Practitioner

## 2013-07-23 VITALS — BP 122/74 | Temp 98.8°F | Ht 71.0 in | Wt 189.0 lb

## 2013-07-23 DIAGNOSIS — J309 Allergic rhinitis, unspecified: Secondary | ICD-10-CM

## 2013-07-23 DIAGNOSIS — J300 Vasomotor rhinitis: Secondary | ICD-10-CM

## 2013-07-23 MED ORDER — METHYLPREDNISOLONE ACETATE 40 MG/ML IJ SUSP
40.0000 mg | Freq: Once | INTRAMUSCULAR | Status: AC
  Administered 2013-07-23: 40 mg via INTRAMUSCULAR

## 2013-07-23 NOTE — Patient Instructions (Signed)
OTC antihistamine as directed Nasacort AQ as directed 

## 2013-07-27 ENCOUNTER — Encounter: Payer: Self-pay | Admitting: Nurse Practitioner

## 2013-07-27 NOTE — Progress Notes (Signed)
Subjective:  Presents with complaints of bilateral ear pain for the past 2 months. Feels like she has had muffled hearing or the sensation of being at the "bottom of a barrel". No fever cough runny nose sore throat. No headache. Some sneezing at times. Seems to be worse when she is at work, works as a Scientist, research (physical sciences).  Objective:   BP 122/74  Temp(Src) 98.8 F (37.1 C) (Oral)  Ht 5\' 11"  (1.803 m)  Wt 189 lb (85.73 kg)  BMI 26.37 kg/m2 NAD. Alert, oriented. TMs significant clear effusion, no erythema. Pharynx clear. Nasal mucosa pale and slightly boggy. Neck supple with mild soft nontender adenopathy. Lungs clear. Heart regular rate rhythm.  Assessment:Allergic rhinitis - Plan: methylPREDNISolone acetate (DEPO-MEDROL) injection 40 mg  Vasomotor rhinitis - Plan: methylPREDNISolone acetate (DEPO-MEDROL) injection 40 mg  Plan: Meds ordered this encounter  Medications  . methylPREDNISolone acetate (DEPO-MEDROL) injection 40 mg    Sig:    OTC antihistamine as directed Nasacort AQ as directed. Call back if symptoms worsen or persist.

## 2013-09-06 ENCOUNTER — Ambulatory Visit (INDEPENDENT_AMBULATORY_CARE_PROVIDER_SITE_OTHER): Payer: BC Managed Care – PPO | Admitting: Nurse Practitioner

## 2013-09-06 ENCOUNTER — Encounter: Payer: Self-pay | Admitting: Nurse Practitioner

## 2013-09-06 VITALS — BP 124/70 | Temp 99.2°F | Ht 71.0 in | Wt 195.2 lb

## 2013-09-06 DIAGNOSIS — J111 Influenza due to unidentified influenza virus with other respiratory manifestations: Secondary | ICD-10-CM

## 2013-09-06 MED ORDER — OSELTAMIVIR PHOSPHATE 75 MG PO CAPS: 75.0000 mg | ORAL_CAPSULE | Freq: Two times a day (BID) | ORAL | Status: DC

## 2013-09-08 ENCOUNTER — Encounter: Payer: Self-pay | Admitting: Nurse Practitioner

## 2013-09-08 NOTE — Progress Notes (Signed)
Subjective:  Presents with complaints of sudden onset cough, muscle aches headache and malaise that began last night. Chest pain/burning with deep breath or cough. No shortness of breath. Minimal wheezing. Has albuterol inhaler, has not used this. No sore throat or ear pain. No fever but has had chills. No vomiting diarrhea or abdominal pain. Taking fluids well. Voiding normal limit.   Objective:   BP 124/70  Temp(Src) 99.2 F (37.3 C)  Ht 5\' 11"  (1.803 m)  Wt 195 lb 3.2 oz (88.542 kg)  BMI 27.24 kg/m2 NAD. Alert, oriented. Fatigued in appearance. TMs normal limit. Pharynx clear. Neck supple with minimal adenopathy. Lungs clear. Heart regular rate rhythm.  Assessment:Influenza  Plan: Influenza-the patient was diagnosed with influenza. Patient/family educated about the flu and warning signs to watch for. If difficulty breathing, severe neck pain and stiffness, cyanosis, disorientation, or progressive worsening then immediately get rechecked at that ER. If progressive symptoms be certain to be rechecked. Supportive measures such as Tylenol/ibuprofen was discussed. No aspirin use in children. And influenza home care instruction sheet was given. Tamiflu prescribed.

## 2013-10-05 ENCOUNTER — Other Ambulatory Visit: Payer: BC Managed Care – PPO | Admitting: Women's Health

## 2013-10-07 ENCOUNTER — Other Ambulatory Visit (HOSPITAL_COMMUNITY)
Admission: RE | Admit: 2013-10-07 | Discharge: 2013-10-07 | Disposition: A | Discharge status: Home / Self Care | Payer: BC Managed Care – PPO | Source: Ambulatory Visit | Attending: Adult Health | Admitting: Adult Health

## 2013-10-07 ENCOUNTER — Ambulatory Visit (INDEPENDENT_AMBULATORY_CARE_PROVIDER_SITE_OTHER): Payer: BC Managed Care – PPO | Admitting: Adult Health

## 2013-10-07 ENCOUNTER — Encounter: Payer: Self-pay | Admitting: Adult Health

## 2013-10-07 ENCOUNTER — Encounter (INDEPENDENT_AMBULATORY_CARE_PROVIDER_SITE_OTHER): Payer: Self-pay

## 2013-10-07 VITALS — BP 120/70 | HR 70 | Ht 71.0 in | Wt 190.0 lb

## 2013-10-07 DIAGNOSIS — Z01419 Encounter for gynecological examination (general) (routine) without abnormal findings: Secondary | ICD-10-CM | POA: Insufficient documentation

## 2013-10-07 DIAGNOSIS — Z1151 Encounter for screening for human papillomavirus (HPV): Secondary | ICD-10-CM | POA: Insufficient documentation

## 2013-10-07 DIAGNOSIS — Z309 Encounter for contraceptive management, unspecified: Secondary | ICD-10-CM

## 2013-10-07 HISTORY — DX: Encounter for contraceptive management, unspecified: Z30.9

## 2013-10-07 NOTE — Progress Notes (Signed)
Patient ID: [name] [name], female   DOB: 01/29/1981, 33 y.o.   MRN: [id number] History of Present Illness: [name] is a 33 year old white female, married in for a pap and physical.No complaints, [place].Runs 6 miles 3 x weekly and in gym 2 days per week.   Current Medications, Allergies, Past Medical History, Past Surgical History, Family History and Social History were reviewed in [facility] electronic medical record.     Review of Systems: Patient denies any headaches, blurred vision, shortness of breath, chest pain, abdominal pain, problems with bowel movements, urination, or intercourse. No joint pain ,has history of depression on Effexor and doing well.    Physical Exam:BP 120/70  Pulse 70  Ht 5\' 11"  (1.803 m)  Wt 190 lb (86.183 kg)  BMI 26.51 kg/m2  LMP 09/22/2013 General:  Well developed, well nourished, no acute distress Skin:  Warm and dry Neck:  Midline trachea, normal thyroid Lungs; Clear to auscultation bilaterally Breast:  No dominant palpable mass, retraction, or nipple discharge Cardiovascular: Regular rate and rhythm Abdomen:  Soft, non tender, no hepatosplenomegaly Pelvic:  External genitalia is normal in appearance.  The vagina is normal in appearance.  The cervix is bulbous.Pap with HPV performed.  Uterus is felt to be normal size, shape, and contour.  No                adnexal masses or tenderness noted. Extremities:  No swelling or varicosities noted Psych:  No mood changes, alert and cooperative,seems happy   Impression: Yearly gyn exam Contraceptive management   Plan: Physical in 1-2 years, pap in 3 if negative HPV Mammogram at 39  Call if needs refills on OCs

## 2013-10-07 NOTE — Patient Instructions (Signed)
Physical in 1-2 years Pap in 3 years Mammogram at [age]

## 2013-11-15 ENCOUNTER — Telehealth: Payer: Self-pay | Admitting: Adult Health

## 2013-11-15 NOTE — Telephone Encounter (Signed)
Pt informed of normal pap from 10/07/2013.

## 2014-01-10 ENCOUNTER — Telehealth: Payer: Self-pay | Admitting: Family Medicine

## 2014-01-10 MED ORDER — VENLAFAXINE HCL ER 75 MG PO CP24: ORAL_CAPSULE | ORAL | Status: DC

## 2014-01-10 NOTE — Telephone Encounter (Signed)
Patient needs Rx for venlafaxine HCL ER 75 mg to [facility] in [place] (she has moved there, but not yet released records).    Phone [phone] Fax [phone]

## 2014-07-18 ENCOUNTER — Encounter: Payer: Self-pay | Admitting: Adult Health

## 2014-08-09 ENCOUNTER — Other Ambulatory Visit: Payer: Self-pay | Admitting: *Deleted

## 2014-08-09 ENCOUNTER — Telehealth: Payer: Self-pay | Admitting: Family Medicine

## 2014-08-09 MED ORDER — VENLAFAXINE HCL ER 75 MG PO CP24: ORAL_CAPSULE | ORAL | Status: DC

## 2014-08-09 NOTE — Telephone Encounter (Signed)
venlafaxine XR (EFFEXOR XR) 75 MG 24 hr capsule   Pt needs this refilled   [place] [facility]    Advised pt she will need to find a doc in [place] at [facility] to keep getting refills, she understood   Wants a call when sent

## 2014-08-09 NOTE — Telephone Encounter (Signed)
This pts family moved to [place] many mo ago, extend med one mo as courtesy plus one mo ref, definietyly needs f u visit with clinician in [place]

## 2014-08-09 NOTE — Telephone Encounter (Signed)
Discussed with patient. Med sent to pharm.  

## 2014-08-09 NOTE — Telephone Encounter (Signed)
Last seen 09/06/13 for sickness and last seen for depression 06/29/13

## 2015-01-09 DIAGNOSIS — Z029 Encounter for administrative examinations, unspecified: Secondary | ICD-10-CM

## 2018-05-20 ENCOUNTER — Other Ambulatory Visit (HOSPITAL_COMMUNITY)
Admission: RE | Admit: 2018-05-20 | Discharge: 2018-05-20 | Disposition: A | Discharge status: Home / Self Care | Payer: BC Managed Care – PPO | Source: Ambulatory Visit | Attending: Advanced Practice Midwife | Admitting: Advanced Practice Midwife

## 2018-05-20 ENCOUNTER — Encounter (INDEPENDENT_AMBULATORY_CARE_PROVIDER_SITE_OTHER): Payer: Self-pay

## 2018-05-20 ENCOUNTER — Encounter: Payer: Self-pay | Admitting: Advanced Practice Midwife

## 2018-05-20 ENCOUNTER — Ambulatory Visit (INDEPENDENT_AMBULATORY_CARE_PROVIDER_SITE_OTHER): Payer: BC Managed Care – PPO | Admitting: Advanced Practice Midwife

## 2018-05-20 VITALS — BP 112/65 | HR 62 | Ht 71.0 in | Wt 177.5 lb

## 2018-05-20 DIAGNOSIS — Z01419 Encounter for gynecological examination (general) (routine) without abnormal findings: Secondary | ICD-10-CM | POA: Diagnosis not present

## 2018-05-20 MED ORDER — DESOGESTREL-ETHINYL ESTRADIOL 0.15-0.02/0.01 MG (21/5) PO TABS: 1.0000 | ORAL_TABLET | Freq: Every day | ORAL | 6 refills | Status: DC

## 2018-05-20 NOTE — Progress Notes (Signed)
[name] 37 y.o.  Vitals:   05/20/18 1610  BP: 112/65  Pulse: 62     Filed Weights   05/20/18 1610  Weight: 177 lb 8 oz (80.5 kg)    Past Medical History: Past Medical History:  Diagnosis Date  . Contraception management 10/07/2013  . Mental disorder    depression    Past Surgical History: Past Surgical History:  Procedure Laterality Date  . APPENDECTOMY      Family History: Family History  Problem Relation Age of Onset  . Heart disease Father   . Heart attack Father   . Heart disease Maternal Grandmother   . Heart attack Maternal Grandmother   . Asthma Maternal Grandmother   . Emphysema Maternal Grandmother   . Heart attack Maternal Grandfather   . Heart disease Maternal Grandfather   . Heart attack Paternal Grandmother   . Heart disease Paternal Grandmother   . Asthma Paternal Grandmother   . Emphysema Paternal Grandmother   . Heart attack Paternal Grandfather   . Heart disease Paternal Grandfather     Social History: Social History   Tobacco Use  . Smoking status: Never Smoker  . Smokeless tobacco: Never Used  Substance Use Topics  . Alcohol use: No  . Drug use: No    Allergies: No Known Allergies    Current Outpatient Medications:  .  AZURETTE 0.15-0.02/0.01 MG (21/5) tablet, TAKE ONE TABLET BY MOUTH EVERY DAY, Disp: 1 Package, Rfl: 6 .  buPROPion (WELLBUTRIN XL) 300 MG 24 hr tablet, Take 300 mg by mouth daily., Disp: , Rfl: 2 .  clonazePAM (KLONOPIN) 1 MG tablet, Take 1 mg by mouth 2 (two) times daily as needed., Disp: , Rfl: 0 .  venlafaxine XR (EFFEXOR XR) 75 MG 24 hr capsule, Take 2 capsules BID (Patient not taking: Reported on 05/20/2018), Disp: 120 capsule, Rfl: 1  History of Present Illness: here for pap. Last pap 2015, normal.  Using azurette for heavy periods.  Husband had vasectomy a few years ago, but pt's periods were too heavy and painful so went back on COCs.  Interested in endo ablation. 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    Review of Systems   Patient denies any headaches, blurred vision, shortness of breath, chest pain, abdominal pain, problems with bowel movements, urination, or intercourse.   Physical Exam: General:  Well developed, well nourished, no acute distress Skin:  Warm and dry Neck:  Midline trachea, normal thyroid Lungs; Clear to auscultation bilaterally Breast:  No dominant palpable mass, retraction, or nipple discharge Cardiovascular: Regular rate and rhythm Abdomen:  Soft, non tender, no hepatosplenomegaly Pelvic:  External genitalia is normal in appearance.  The vagina is normal in appearance.  The cervix is bulbous.  Uterus is felt to be normal size, shape, and contour.  No adnexal masses or tenderness noted.  Extremities:  No swelling or varicosities noted Psych:  No mood changes.     Impression: Normal GYN exam     Plan: if normal, pap q 3years  Check insurance on endo ablation

## 2018-05-20 NOTE — Patient Instructions (Signed)
Let me know if you want an ablation and I will get everything set up for you (appointment-wise)

## 2018-05-22 LAB — CYTOLOGY - PAP
Diagnosis: NEGATIVE
HPV (WINDOPATH): NOT DETECTED

## 2018-11-23 ENCOUNTER — Telehealth: Payer: Self-pay | Admitting: Obstetrics & Gynecology

## 2018-11-23 ENCOUNTER — Other Ambulatory Visit: Payer: Self-pay | Admitting: Advanced Practice Midwife

## 2018-11-23 MED ORDER — DESOGESTREL-ETHINYL ESTRADIOL 0.15-0.02/0.01 MG (21/5) PO TABS: 1.0000 | ORAL_TABLET | Freq: Every day | ORAL | 6 refills | Status: DC

## 2018-11-23 NOTE — Telephone Encounter (Signed)
Patient called, stated that the pharmacy requested a refill on her bc last Thursday and it has been filled.  [facility]  [phone]

## 2018-11-23 NOTE — Telephone Encounter (Signed)
Pt requesting refill on birth control. Advised that I would send her request to a provider and she could check with the pharmacy later today. Pt verbalized understanding.

## 2019-06-05 ENCOUNTER — Other Ambulatory Visit: Payer: Self-pay | Admitting: Advanced Practice Midwife

## 2020-05-06 ENCOUNTER — Other Ambulatory Visit: Payer: Self-pay | Admitting: Advanced Practice Midwife

## 2020-05-09 ENCOUNTER — Telehealth: Payer: Self-pay | Admitting: Adult Health

## 2020-05-09 NOTE — Telephone Encounter (Signed)
Patient would like to know if [facility] refill can be sent to her pharmacy. Patient states pharmacy has requested refill via fax several times. Advised patient she may need to schedule appointment to have birth control filled due to her last visit was in 2019. Patient has upcoming pap/phys scheduled on 05/17/20. Please notify patient if [facility] refill can be sent to pharmacy to hold her til her upcoming appointment.

## 2020-05-10 MED ORDER — DESOGESTREL-ETHINYL ESTRADIOL 0.15-0.02/0.01 MG (21/5) PO TABS: 1.0000 | ORAL_TABLET | Freq: Every day | ORAL | 1 refills | Status: DC

## 2020-05-10 NOTE — Addendum Note (Signed)
Addended by: Cyril Mourning A on: 05/10/2020 08:27 AM   Modules accepted: Orders

## 2020-05-10 NOTE — Telephone Encounter (Signed)
Refilled birth control, has appt

## 2020-05-10 NOTE — Telephone Encounter (Signed)
Telephoned patient at home number and advised patient prescription was sent to pharmacy. Patient voiced understanding.

## 2020-05-11 NOTE — Telephone Encounter (Signed)
Refilled by [facility] last week

## 2020-05-17 ENCOUNTER — Other Ambulatory Visit: Payer: BC Managed Care – PPO | Admitting: Women's Health

## 2020-06-06 ENCOUNTER — Other Ambulatory Visit: Payer: BC Managed Care – PPO | Admitting: Women's Health

## 2020-06-26 ENCOUNTER — Telehealth: Payer: Self-pay | Admitting: Adult Health

## 2020-06-26 MED ORDER — DESOGESTREL-ETHINYL ESTRADIOL 0.15-0.02/0.01 MG (21/5) PO TABS: 1.0000 | ORAL_TABLET | Freq: Every day | ORAL | 1 refills | Status: DC

## 2020-06-26 NOTE — Addendum Note (Signed)
Addended by: Cyril Mourning A on: 06/26/2020 05:07 PM   Modules accepted: Orders

## 2020-06-26 NOTE — Telephone Encounter (Signed)
Refilled OCs, has appt

## 2020-06-26 NOTE — Telephone Encounter (Signed)
Pt would like bc refilled til next appointment on 07/10/20

## 2020-07-10 ENCOUNTER — Encounter: Payer: Self-pay | Admitting: Women's Health

## 2020-07-10 ENCOUNTER — Ambulatory Visit (INDEPENDENT_AMBULATORY_CARE_PROVIDER_SITE_OTHER): Payer: BC Managed Care – PPO | Admitting: Women's Health

## 2020-07-10 VITALS — BP 118/76 | HR 61 | Ht 71.0 in | Wt 200.0 lb

## 2020-07-10 DIAGNOSIS — Z3041 Encounter for surveillance of contraceptive pills: Secondary | ICD-10-CM

## 2020-07-10 DIAGNOSIS — Z01419 Encounter for gynecological examination (general) (routine) without abnormal findings: Secondary | ICD-10-CM | POA: Diagnosis not present

## 2020-07-10 MED ORDER — DESOGESTREL-ETHINYL ESTRADIOL 0.15-0.02/0.01 MG (21/5) PO TABS: 1.0000 | ORAL_TABLET | Freq: Every day | ORAL | 11 refills | Status: DC

## 2020-07-10 NOTE — Patient Instructions (Signed)
Call [facility] to schedule your screening mammogram, [phone]

## 2020-07-10 NOTE — Progress Notes (Signed)
WELL-WOMAN EXAMINATION Patient name: [name] MRN [id number]  Date of birth: [birth year] Chief Complaint:   Gynecologic Exam  History of Present Illness:   [name] is a 39 y.o. G2P2 Caucasian female being seen today for a routine well-woman exam.  Current complaints: none, needs refill on COCs  Depression screen [facility] 2/9 07/10/2020 05/20/2018  Decreased Interest 0 0  Down, Depressed, Hopeless 0 1  PHQ - 2 Score 0 1  Altered sleeping 1 -  Tired, decreased energy 0 -  Change in appetite 0 -  Feeling bad or failure about yourself  1 -  Trouble concentrating 0 -  Moving slowly or fidgety/restless 0 -  Suicidal thoughts 0 -  PHQ-9 Score 2 -     PCP: [facility]      does not desire labs Patient's last menstrual period was 06/27/2020. The current method of family planning is OCP (estrogen/progesterone) and vasectomy. Takes OCPs for period management, working well Last pap 05/20/18. Results were: normal. H/O abnormal pap: yes with multiple normal since Last mammogram: 69yrs ago d/t lump. Results were: normal. Family h/o breast cancer: no Last colonoscopy: never. Results were: N/A. Family h/o colorectal cancer: no    Pertinent items are noted in HPI Denies any headaches, blurred vision, fatigue, shortness of breath, chest pain, abdominal pain, abnormal vaginal discharge/itching/odor/irritation, problems with periods, bowel movements, urination, or intercourse unless otherwise stated above. Pertinent History Reviewed:  Reviewed past medical,surgical, social and family history.  Reviewed problem list, medications and allergies. Physical Assessment:   Vitals:   07/10/20 1356  BP: 118/76  Pulse: 61  Weight: 200 lb (90.7 kg)  Height: 5\' 11"  (1.803 m)  Body mass index is 27.89 kg/m.        Physical Examination:   General appearance - well appearing, and in no distress  Mental status - alert, oriented to person, place, and time  Psych:  She has a normal mood and  affect  Skin - warm and dry, normal color, no suspicious lesions noted  Chest - effort normal, all lung fields clear to auscultation bilaterally  Heart - normal rate and regular rhythm  Neck:  midline trachea, no thyromegaly or nodules  Breasts - breasts appear normal, no suspicious masses, no skin or nipple changes or  axillary nodes  Abdomen - soft, nontender, nondistended, no masses or organomegaly  Pelvic - VULVA: normal appearing vulva with no masses, tenderness or lesions  VAGINA: normal appearing vagina with normal color and discharge, no lesions  CERVIX: normal appearing cervix without discharge or lesions, no CMT  Thin prep pap is not done  UTERUS: uterus is felt to be normal size, shape, consistency and nontender   ADNEXA: No adnexal masses or tenderness noted.  Extremities:  No swelling or varicosities noted  Chaperone: & Faith Rogue  No results found for this or any previous visit (from the past 24 hour(s)).  Assessment & Plan:  1) Well-Woman Exam  2) Contraception management> refilled COCs x 54yr  Labs/procedures today: exam  Mammogram-call now to schedule screening mammo Colonoscopy @40yo  or sooner if problems  No orders of the defined types were placed in this encounter.   Meds:  Meds ordered this encounter  Medications  . desogestrel-ethinyl estradiol (PIMTREA) 0.15-0.02/0.01 MG (21/5) tablet    Sig: Take 1 tablet by mouth daily.    Dispense:  28 tablet    Refill:  11    Order Specific Question:   Supervising Provider  Answer:   Lazaro Arms [[id number]]    Follow-up: Return in about 1 year (around 07/10/2021) for Physical.  Cheral Marker CNM, WHNP-BC 07/10/2020 2:26 PM

## 2021-06-29 ENCOUNTER — Other Ambulatory Visit: Payer: Self-pay | Admitting: Women's Health

## 2021-09-19 ENCOUNTER — Other Ambulatory Visit: Payer: Self-pay

## 2021-09-19 ENCOUNTER — Ambulatory Visit: Payer: BC Managed Care – PPO | Admitting: Adult Health

## 2021-09-19 ENCOUNTER — Encounter: Payer: Self-pay | Admitting: Adult Health

## 2021-09-19 VITALS — BP 129/72 | HR 66 | Ht 71.0 in | Wt 199.8 lb

## 2021-09-19 DIAGNOSIS — Z3041 Encounter for surveillance of contraceptive pills: Secondary | ICD-10-CM | POA: Diagnosis not present

## 2021-09-19 DIAGNOSIS — N6312 Unspecified lump in the right breast, upper inner quadrant: Secondary | ICD-10-CM | POA: Diagnosis not present

## 2021-09-19 MED ORDER — DESOGESTREL-ETHINYL ESTRADIOL 0.15-0.02/0.01 MG (21/5) PO TABS: 1.0000 | ORAL_TABLET | Freq: Every day | ORAL | 2 refills | Status: DC

## 2021-09-19 NOTE — Progress Notes (Signed)
°  Subjective:     Patient ID: [name], female   DOB: April 05, 1981, 41 y.o.   MRN: [id number]  HPI [name] is a 41 year old white female,married, G2P2 in complaining of having felt lump under left arm last week,that has since resolved.And lump top part of right breast.Needs OCs refilled too.  PCP is [facility].  Lab Results  Component Value Date   DIAGPAP  05/20/2018    NEGATIVE FOR INTRAEPITHELIAL LESIONS OR MALIGNANCY.   HPV NOT DETECTED 05/20/2018    Review of Systems Breast lump x 1 week  Reviewed past medical,surgical, social and family history. Reviewed medications and allergies.     Objective:   Physical Exam BP 129/72 (BP Location: Right Arm, Patient Position: Sitting, Cuff Size: Normal)    Pulse 66    Ht 5\' 11"  (1.803 m)    Wt 199 lb 12.8 oz (90.6 kg)    LMP 09/19/2021 (Exact Date)    BMI 27.87 kg/m      Skin warm and dry,  Breasts:no dominate palpable mass, retraction or nipple discharge on the left and no mass felt under arm. On the right, no retraction or nipple discharge but about 3.5 cm irregular shaped mass, slightly tender at 12-1 o'clock.  [facility] - 09/19/21 1514       Pregnancy Intention Screening   Does the patient want to become pregnant in the next year? No    Does the patient's partner want to become pregnant in the next year? No    Would the patient like to discuss contraceptive options today? No      Contraception Wrap Up   Current Method Oral Contraceptive    End Method Oral Contraceptive    Contraception Counseling Provided No             Assessment:     1. Mass of upper inner quadrant of right breast Diagnostic mammogram scheduled for her at [facility] 10/16/21 at 3 pm, they will call if earlier appt opens up  - 10/18/21 BREAST LTD UNI RIGHT INC AXILLA; Future - MM DIAG BREAST TOMO BILATERAL; Future - US BREAST LTD UNI LEFT INC AXILLA; Future  2. Encounter for surveillance of contraceptive pills Refills COC till appt     Plan:     Return  for pap and physical 10/08/21 with me

## 2021-10-08 ENCOUNTER — Other Ambulatory Visit (HOSPITAL_COMMUNITY)
Admission: RE | Admit: 2021-10-08 | Discharge: 2021-10-08 | Disposition: A | Discharge status: Home / Self Care | Payer: BC Managed Care – PPO | Source: Ambulatory Visit | Attending: Adult Health | Admitting: Adult Health

## 2021-10-08 ENCOUNTER — Encounter: Payer: Self-pay | Admitting: Adult Health

## 2021-10-08 ENCOUNTER — Other Ambulatory Visit: Payer: Self-pay

## 2021-10-08 ENCOUNTER — Ambulatory Visit (INDEPENDENT_AMBULATORY_CARE_PROVIDER_SITE_OTHER): Payer: BC Managed Care – PPO | Admitting: Adult Health

## 2021-10-08 VITALS — BP 134/83 | HR 68 | Ht 69.5 in | Wt 202.0 lb

## 2021-10-08 DIAGNOSIS — Z3041 Encounter for surveillance of contraceptive pills: Secondary | ICD-10-CM

## 2021-10-08 DIAGNOSIS — N6312 Unspecified lump in the right breast, upper inner quadrant: Secondary | ICD-10-CM

## 2021-10-08 DIAGNOSIS — Z1211 Encounter for screening for malignant neoplasm of colon: Secondary | ICD-10-CM

## 2021-10-08 DIAGNOSIS — Z01419 Encounter for gynecological examination (general) (routine) without abnormal findings: Secondary | ICD-10-CM | POA: Insufficient documentation

## 2021-10-08 LAB — HEMOCCULT GUIAC POC 1CARD (OFFICE): Fecal Occult Blood, POC: NEGATIVE

## 2021-10-08 MED ORDER — DESOGESTREL-ETHINYL ESTRADIOL 0.15-0.02/0.01 MG (21/5) PO TABS: 1.0000 | ORAL_TABLET | Freq: Every day | ORAL | 4 refills | Status: DC

## 2021-10-08 NOTE — Progress Notes (Signed)
Patient ID: [name], female   DOB: 31-May-1981, 41 y.o.   MRN: [id number] History of Present Illness: [name] is a 41 year old white female, married, G2P2 in for a well woman gyn exam and pap. PP is [facility].   Current Medications, Allergies, Past Medical History, Past Surgical History, Family History and Social History were reviewed in [employer] record.     Review of Systems:  Patient denies any headaches, hearing loss, fatigue, blurred vision, shortness of breath, chest pain, abdominal pain, problems with bowel movements, urination, or intercourse. No joint pain or mood swings.    Physical Exam:BP 134/83 (BP Location: Left Arm, Patient Position: Sitting, Cuff Size: Normal)    Pulse 68    Ht 5' 9.5" (1.765 m)    Wt 202 lb (91.6 kg)    LMP 09/19/2021 (Exact Date)    BMI 29.40 kg/m   General:  Well developed, well nourished, no acute distress Skin:  Warm and dry Neck:  Midline trachea, normal thyroid, good ROM, no lymphadenopathy Lungs; Clear to auscultation bilaterally Breast:  No dominant palpable mass, retraction, or nipple discharge on left, on right, no retraction or nipple discharge, has 3 cm mass at 12-1 0:clock, was seen 09/19/21 and has diagnostic mammogram 10/16/21. Cardiovascular: Regular rate and rhythm Abdomen:  Soft, non tender, no hepatosplenomegaly Pelvic:  External genitalia is normal in appearance, no lesions.  The vagina is normal in appearance. Urethra has no lesions or masses. The cervix is bulbous.Pap with HR HPV genotyping performed.  Uterus is felt to be normal size, shape, and contour.  No adnexal masses or tenderness noted.Bladder is non tender, no masses felt. Rectal: Good sphincter tone, no polyps, or hemorrhoids felt.  Hemoccult negative. Extremities/musculoskeletal:  No swelling or varicosities noted, no clubbing or cyanosis Psych:  No mood changes, alert and cooperative,seems happy AA is 1 Fall risk is low Depression screen [facility]  2/9 10/08/2021 07/10/2020 05/20/2018  Decreased Interest 3 0 0  Down, Depressed, Hopeless 3 0 1  PHQ - 2 Score 6 0 1  Altered sleeping 2 1 -  Tired, decreased energy 2 0 -  Change in appetite 0 0 -  Feeling bad or failure about yourself  3 1 -  Trouble concentrating 0 0 -  Moving slowly or fidgety/restless 0 0 -  Suicidal thoughts 0 0 -  PHQ-9 Score 13 2 -   She is on meds  GAD 7 : Generalized Anxiety Score 10/08/2021 07/10/2020  Nervous, Anxious, on Edge 1 0  Control/stop worrying 2 0  Worry too much - different things 2 0  Trouble relaxing 3 1  Restless 1 0  Easily annoyed or irritable 3 1  Afraid - awful might happen 2 0  Total GAD 7 Score 14 2      Upstream - 10/08/21 0947       Pregnancy Intention Screening   Does the patient want to become pregnant in the next year? No    Does the patient's partner want to become pregnant in the next year? No    Would the patient like to discuss contraceptive options today? No      Contraception Wrap Up   Current Method Oral Contraceptive    End Method Oral Contraceptive    Contraception Counseling Provided No             Examination chaperoned by Malachy Mood LPN  Impression and Plan: 1. Encounter for gynecological examination with Papanicolaou smear of cervix Pap  sent Pap in 3 years if normal Physical in 1 year   2. Encounter for surveillance of contraceptive pills Meds ordered this encounter  Medications   desogestrel-ethinyl estradiol (AZURETTE) 0.15-0.02/0.01 MG (21/5) tablet    Sig: Take 1 tablet by mouth daily.    Dispense:  84 tablet    Refill:  4    Order Specific Question:   Supervising Provider    Answer:   Despina Hidden, LUTHER H [[id number]]     3. Mass of upper inner quadrant of right breast Has diagnostic mammogram already scheduled for 10/16/21  4. Encounter for screening fecal occult blood testing

## 2021-10-09 LAB — CYTOLOGY - PAP
Adequacy: ABSENT
Comment: NEGATIVE
Diagnosis: NEGATIVE
High risk HPV: NEGATIVE

## 2021-10-16 ENCOUNTER — Other Ambulatory Visit (HOSPITAL_COMMUNITY): Payer: Self-pay | Admitting: Adult Health

## 2021-10-16 ENCOUNTER — Ambulatory Visit (HOSPITAL_COMMUNITY)
Admission: RE | Admit: 2021-10-16 | Discharge: 2021-10-16 | Disposition: A | Discharge status: Home / Self Care | Payer: BC Managed Care – PPO | Source: Ambulatory Visit | Attending: Adult Health | Admitting: Adult Health

## 2021-10-16 ENCOUNTER — Encounter (HOSPITAL_COMMUNITY): Payer: Self-pay

## 2021-10-16 ENCOUNTER — Other Ambulatory Visit: Payer: Self-pay

## 2021-10-16 DIAGNOSIS — N6312 Unspecified lump in the right breast, upper inner quadrant: Secondary | ICD-10-CM | POA: Diagnosis present

## 2021-10-16 DIAGNOSIS — R928 Other abnormal and inconclusive findings on diagnostic imaging of breast: Secondary | ICD-10-CM

## 2021-10-23 ENCOUNTER — Other Ambulatory Visit (HOSPITAL_COMMUNITY): Payer: Self-pay | Admitting: Adult Health

## 2021-10-23 ENCOUNTER — Ambulatory Visit (HOSPITAL_COMMUNITY)
Admission: RE | Admit: 2021-10-23 | Discharge: 2021-10-23 | Disposition: A | Discharge status: Home / Self Care | Payer: BC Managed Care – PPO | Source: Ambulatory Visit | Attending: Adult Health | Admitting: Adult Health

## 2021-10-23 ENCOUNTER — Encounter (HOSPITAL_COMMUNITY): Payer: Self-pay

## 2021-10-23 ENCOUNTER — Other Ambulatory Visit: Payer: Self-pay

## 2021-10-23 DIAGNOSIS — R928 Other abnormal and inconclusive findings on diagnostic imaging of breast: Secondary | ICD-10-CM

## 2021-10-23 MED ORDER — LIDOCAINE HCL (PF) 2 % IJ SOLN
INTRAMUSCULAR | Status: AC
  Filled 2021-10-23: qty 10

## 2021-10-23 MED ORDER — LIDOCAINE-EPINEPHRINE (PF) 1 %-1:200000 IJ SOLN
INTRAMUSCULAR | Status: AC
  Filled 2021-10-23: qty 30

## 2021-10-24 LAB — SURGICAL PATHOLOGY

## 2022-11-01 ENCOUNTER — Other Ambulatory Visit: Payer: Self-pay | Admitting: Adult Health

## 2023-06-09 ENCOUNTER — Encounter: Payer: Self-pay | Admitting: Adult Health

## 2023-06-09 ENCOUNTER — Ambulatory Visit: Payer: 59 | Admitting: Adult Health

## 2023-06-09 VITALS — BP 144/93 | HR 85 | Ht 71.0 in | Wt 221.5 lb

## 2023-06-09 DIAGNOSIS — Z3041 Encounter for surveillance of contraceptive pills: Secondary | ICD-10-CM | POA: Diagnosis not present

## 2023-06-09 DIAGNOSIS — F419 Anxiety disorder, unspecified: Secondary | ICD-10-CM

## 2023-06-09 DIAGNOSIS — R030 Elevated blood-pressure reading, without diagnosis of hypertension: Secondary | ICD-10-CM

## 2023-06-09 DIAGNOSIS — R5383 Other fatigue: Secondary | ICD-10-CM

## 2023-06-09 DIAGNOSIS — Z3202 Encounter for pregnancy test, result negative: Secondary | ICD-10-CM | POA: Diagnosis not present

## 2023-06-09 DIAGNOSIS — N926 Irregular menstruation, unspecified: Secondary | ICD-10-CM | POA: Insufficient documentation

## 2023-06-09 DIAGNOSIS — F411 Generalized anxiety disorder: Secondary | ICD-10-CM | POA: Insufficient documentation

## 2023-06-09 DIAGNOSIS — E538 Deficiency of other specified B group vitamins: Secondary | ICD-10-CM | POA: Insufficient documentation

## 2023-06-09 DIAGNOSIS — F32A Depression, unspecified: Secondary | ICD-10-CM

## 2023-06-09 LAB — POCT URINE PREGNANCY: Preg Test, Ur: NEGATIVE

## 2023-06-09 MED ORDER — LO LOESTRIN FE 1 MG-10 MCG / 10 MCG PO TABS: 1.0000 | ORAL_TABLET | Freq: Every day | ORAL | 0 refills | Status: DC

## 2023-06-09 MED ORDER — BUSPIRONE HCL 5 MG PO TABS: 5.0000 mg | ORAL_TABLET | Freq: Three times a day (TID) | ORAL | 4 refills | Status: DC

## 2023-06-09 NOTE — Progress Notes (Signed)
Subjective:     Patient ID: [name], female   DOB: 16-Apr-1981, 42 y.o.   MRN: [id number]  HPI [name]  is a 42 year old white female, married, G2P2 in complaining of irregular bleeding and feels tired and depressed. She fractured right ankle in March and has been going to PT, and thinks this may have contributed to depression. She is on Wellbutrin and Cymbalta.    Component Value Date/Time   DIAGPAP  10/08/2021 0950    - Negative for intraepithelial lesion or malignancy (NILM)   DIAGPAP  05/20/2018 0000    NEGATIVE FOR INTRAEPITHELIAL LESIONS OR MALIGNANCY.   HPVHIGH Negative 10/08/2021 0950   ADEQPAP  10/08/2021 0950    Satisfactory for evaluation; transformation zone component ABSENT.   ADEQPAP  05/20/2018 0000    Satisfactory for evaluation  endocervical/transformation zone component PRESENT.    PCP is [facility]  in [place].  Review of Systems +irregular bleeding +tired +depressed Reviewed past medical,surgical, social and family history. Reviewed medications and allergies.     Objective:   Physical Exam BP (!) 144/93 (BP Location: Left Arm, Patient Position: Sitting, Cuff Size: Normal)   Pulse 85   Ht 5\' 11"  (1.803 m)   Wt 221 lb 8 oz (100.5 kg)   LMP 06/06/2023   BMI 30.89 kg/m  UPT is negative Skin warm and dry.Pelvic: external genitalia is normal in appearance no lesions, vagina: scant blood ,urethra has no lesions or masses noted, cervix:smooth and bulbous, uterus: normal size, shape and contour, non tender, no masses felt, adnexa: no masses or tenderness noted. Bladder is non tender and no masses felt. Fall risk is moderate    06/09/2023    2:10 PM 10/08/2021    9:48 AM 07/10/2020    2:00 PM  Depression screen PHQ 2/9  Decreased Interest 3 3 0  Down, Depressed, Hopeless 3 3 0  PHQ - 2 Score 6 6 0  Altered sleeping 3 2 1   Tired, decreased energy 3 2 0  Change in appetite 2 0 0  Feeling bad or failure about yourself  3 3 1   Trouble concentrating 0 0 0   Moving slowly or fidgety/restless 0 0 0  Suicidal thoughts 0 0 0  PHQ-9 Score 17 13 2   Difficult doing work/chores Somewhat difficult         06/09/2023    2:13 PM 10/08/2021    9:48 AM 07/10/2020    2:00 PM  GAD 7 : Generalized Anxiety Score  Nervous, Anxious, on Edge 3 1 0  Control/stop worrying 3 2 0  Worry too much - different things 3 2 0  Trouble relaxing 3 3 1   Restless 3 1 0  Easily annoyed or irritable 3 3 1   Afraid - awful might happen 3 2 0  Total GAD 7 Score 21 14 2   Anxiety Difficulty Very difficult        Upstream - 06/09/23 1408       Pregnancy Intention Screening   Does the patient want to become pregnant in the next year? No    Does the patient's partner want to become pregnant in the next year? No    Would the patient like to discuss contraceptive options today? No      Contraception Wrap Up   Current Method Oral Contraceptive;Vasectomy    End Method Oral Contraceptive;Vasectomy    Contraception Counseling Provided Yes            Examination chaperoned by Emily Mood  LPN     Assessment:     1. Pregnancy examination or test, negative result - POCT urine pregnancy  2. Encounter for surveillance of contraceptive pills Gave 3 packs of lo Loestrin to start when finishes current pack of pills   3. Irregular bleeding Has irregular bleeding, finish current pack of pills and start lo Loestrin - CBC - TSH + free T4  4. Elevated BP without diagnosis of hypertension She has been inactive since fractured ankle  Will change BCP to lo Loestrin Will recheck BP in 2 months - Comprehensive metabolic panel  5. Anxiety and depression +anxiety and depression Continue Wellbutrin 300 mg 1 daily  and Cymbalta 60 mg 1 daily per PCP  Will add Buspar 5 mg 1 tid Follow up in 2 months  Meds ordered this encounter  Medications   Norethindrone-Ethinyl Estradiol-Fe Biphas (LO LOESTRIN FE) 1 MG-10 MCG / 10 MCG tablet    Sig: Take 1 tablet by mouth daily. Take 1  daily by mouth    Dispense:  84 tablet    Refill:  0    BIN [id number], PCN [id number], GRP [id number]    Order Specific Question:   Supervising Provider    Answer:   Duane Lope H [[id number]]   busPIRone (BUSPAR) 5 MG tablet    Sig: Take 1 tablet (5 mg total) by mouth 3 (three) times daily.    Dispense:  90 tablet    Refill:  4    Order Specific Question:   Supervising Provider    Answer:   Despina Hidden, LUTHER H [[id number]]     6. Tired +tired Will check labs  - CBC - Comprehensive metabolic panel - TSH + free T4     Plan:      Follow up in 2 months for ROS and BP check

## 2023-06-10 LAB — CBC
Hematocrit: 45.6 % (ref 34.0–46.6)
Hemoglobin: 14.9 g/dL (ref 11.1–15.9)
MCH: 31.9 pg (ref 26.6–33.0)
MCHC: 32.7 g/dL (ref 31.5–35.7)
MCV: 98 fL — ABNORMAL HIGH (ref 79–97)
Platelets: 276 10*3/uL (ref 150–450)
RBC: 4.67 x10E6/uL (ref 3.77–5.28)
RDW: 12.7 % (ref 11.7–15.4)
WBC: 9 10*3/uL (ref 3.4–10.8)

## 2023-06-10 LAB — COMPREHENSIVE METABOLIC PANEL
ALT: 45 IU/L — ABNORMAL HIGH (ref 0–32)
AST: 35 IU/L (ref 0–40)
Albumin: 4.5 g/dL (ref 3.9–4.9)
Alkaline Phosphatase: 50 IU/L (ref 44–121)
BUN/Creatinine Ratio: 15 (ref 9–23)
BUN: 13 mg/dL (ref 6–24)
Bilirubin Total: 0.4 mg/dL (ref 0.0–1.2)
CO2: 23 mmol/L (ref 20–29)
Calcium: 10 mg/dL (ref 8.7–10.2)
Chloride: 101 mmol/L (ref 96–106)
Creatinine, Ser: 0.85 mg/dL (ref 0.57–1.00)
Globulin, Total: 2.8 g/dL (ref 1.5–4.5)
Glucose: 90 mg/dL (ref 70–99)
Potassium: 4.2 mmol/L (ref 3.5–5.2)
Sodium: 138 mmol/L (ref 134–144)
Total Protein: 7.3 g/dL (ref 6.0–8.5)
eGFR: 87 mL/min/{1.73_m2} (ref >59)

## 2023-06-10 LAB — TSH+FREE T4
Free T4: 0.95 ng/dL (ref 0.82–1.77)
TSH: 2.4 u[IU]/mL (ref 0.450–4.500)

## 2023-08-08 ENCOUNTER — Ambulatory Visit: Payer: 59 | Admitting: Adult Health

## 2023-08-26 ENCOUNTER — Encounter: Payer: Self-pay | Admitting: Adult Health

## 2023-08-26 ENCOUNTER — Ambulatory Visit: Payer: 59 | Admitting: Adult Health

## 2023-08-26 VITALS — BP 123/84 | HR 79 | Ht 71.0 in | Wt 221.0 lb

## 2023-08-26 DIAGNOSIS — F419 Anxiety disorder, unspecified: Secondary | ICD-10-CM | POA: Diagnosis not present

## 2023-08-26 DIAGNOSIS — N926 Irregular menstruation, unspecified: Secondary | ICD-10-CM | POA: Diagnosis not present

## 2023-08-26 DIAGNOSIS — F32A Depression, unspecified: Secondary | ICD-10-CM

## 2023-08-26 NOTE — Progress Notes (Signed)
Subjective:     Patient ID: [name], female   DOB: 1981-03-01, 42 y.o.   MRN: [id number]  HPI [name] is a 42 year old white female,married, G2P2, back in follow up on starting Buspar and lo Loestrin. Still having irregular bleeding, no a lot, and feels better but just taking 1 Buspar per day, does not feel like needs at much.     Component Value Date/Time   DIAGPAP  10/08/2021 0950    - Negative for intraepithelial lesion or malignancy (NILM)   DIAGPAP  05/20/2018 0000    NEGATIVE FOR INTRAEPITHELIAL LESIONS OR MALIGNANCY.   HPVHIGH Negative 10/08/2021 0950   ADEQPAP  10/08/2021 0950    Satisfactory for evaluation; transformation zone component ABSENT.   ADEQPAP  05/20/2018 0000    Satisfactory for evaluation  endocervical/transformation zone component PRESENT.   PCP is [facility] Review of Systems Still having irregular bleeding Feeling better for anxiety and depression  Reviewed past medical,surgical, social and family history. Reviewed medications and allergies.     Objective:   Physical Exam BP 123/84 (BP Location: Left Arm, Patient Position: Sitting, Cuff Size: Large)   Pulse 79   Ht 5\' 11"  (1.803 m)   Wt 221 lb (100.2 kg)   LMP 08/11/2023 (Exact Date) Comment: spotting all last week for 6 days  BMI 30.82 kg/m     Skin warm and dry.  Lungs: clear to ausculation bilaterally. Cardiovascular: regular rate and rhythm.  Fall risk is low    08/26/2023   12:15 PM 06/09/2023    2:10 PM 10/08/2021    9:48 AM  Depression screen PHQ 2/9  Decreased Interest 1 3 3   Down, Depressed, Hopeless 1 3 3   PHQ - 2 Score 2 6 6   Altered sleeping 1 3 2   Tired, decreased energy 1 3 2   Change in appetite 0 2 0  Feeling bad or failure about yourself  2 3 3   Trouble concentrating 0 0 0  Moving slowly or fidgety/restless 0 0 0  Suicidal thoughts 0 0 0  PHQ-9 Score 6 17 13   Difficult doing work/chores Somewhat difficult Somewhat difficult        08/26/2023   12:16 PM 06/09/2023     2:13 PM 10/08/2021    9:48 AM 07/10/2020    2:00 PM  GAD 7 : Generalized Anxiety Score  Nervous, Anxious, on Edge 0 3 1 0  Control/stop worrying 0 3 2 0  Worry too much - different things 1 3 2  0  Trouble relaxing 0 3 3 1   Restless 0 3 1 0  Easily annoyed or irritable 0 3 3 1   Afraid - awful might happen 0 3 2 0  Total GAD 7 Score 1 21 14 2   Anxiety Difficulty Somewhat difficult Very difficult      Upstream - 08/26/23 1214       Pregnancy Intention Screening   Does the patient want to become pregnant in the next year? No    Does the patient's partner want to become pregnant in the next year? No    Would the patient like to discuss contraceptive options today? No      Contraception Wrap Up   Current Method Vasectomy    End Method Vasectomy    Contraception Counseling Provided No               Assessment:     1. Irregular bleeding Still having irregular bleeding, will stop lo Loestrin after this pack and  see what period does   2. Anxiety and depression Feels better, only taking Buspar  5 mg 1 daily Still on Wellbutrin and  Cymbalta  Has appt with [facility] 09/11/23    Plan:     Follow up prn

## 2023-09-11 ENCOUNTER — Encounter (HOSPITAL_COMMUNITY): Payer: Self-pay | Admitting: Psychiatry

## 2023-09-11 ENCOUNTER — Ambulatory Visit (HOSPITAL_COMMUNITY): Payer: 59 | Admitting: Psychiatry

## 2023-09-11 DIAGNOSIS — Z8659 Personal history of other mental and behavioral disorders: Secondary | ICD-10-CM

## 2023-09-11 DIAGNOSIS — G5713 Meralgia paresthetica, bilateral lower limbs: Secondary | ICD-10-CM | POA: Diagnosis not present

## 2023-09-11 DIAGNOSIS — F339 Major depressive disorder, recurrent, unspecified: Secondary | ICD-10-CM

## 2023-09-11 DIAGNOSIS — Z789 Other specified health status: Secondary | ICD-10-CM

## 2023-09-11 DIAGNOSIS — F411 Generalized anxiety disorder: Secondary | ICD-10-CM

## 2023-09-11 DIAGNOSIS — E559 Vitamin D deficiency, unspecified: Secondary | ICD-10-CM

## 2023-09-11 DIAGNOSIS — F15982 Other stimulant use, unspecified with stimulant-induced sleep disorder: Secondary | ICD-10-CM | POA: Insufficient documentation

## 2023-09-11 DIAGNOSIS — E538 Deficiency of other specified B group vitamins: Secondary | ICD-10-CM

## 2023-09-11 HISTORY — DX: Other specified health status: Z78.9

## 2023-09-11 MED ORDER — DULOXETINE HCL 30 MG PO CPEP: 90.0000 mg | ORAL_CAPSULE | Freq: Every day | ORAL | 1 refills | Status: DC

## 2023-09-11 MED ORDER — BUPROPION HCL ER (XL) 150 MG PO TB24: ORAL_TABLET | ORAL | 0 refills | Status: DC

## 2023-09-11 NOTE — Patient Instructions (Addendum)
We decreased the bupropion XL to 150 mg once daily today.  Take this for the next 1 to 2 weeks and then discontinue.  We also increased the Cymbalta (duloxetine) to 90 mg once daily today.  We will likely continue this for the next month to give it a chance to build up in your system.  In the meantime please begin cutting back on caffeine by half a unit (unit is whenever the caffeine is coming in whether a can, bottle, cup) every 5 days until you are drinking no more than 1 caffeinated unit first thing in the morning.  This should begin to help your insomnia pretty significantly as well as increasing your energy level during the day.  You may also want to coordinate with your PCP about getting a nutrition referral to make sure you are getting the nutrients needed to help your mood.  I would also ask your PCP to perform blind weights when they are obtaining this at your follow-up appointments.  It would also be good to get an iron panel and a B12, folate, vitamin D, magnesium, phosphorus level if you have not gotten one recently.  Depending on how low the vitamin D is, they may want to start you on a 50,000 units supplement once weekly similar to what you are getting with the B12.  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  You may also want to contact her insurer to find someone that practices CBT or cognitive behavioral therapy and you can find the free workbook here: [employer] If the CBT provider has experience with eating disorders that would be good but anyone with experience in depression would be a good place to start.

## 2023-09-11 NOTE — Progress Notes (Signed)
Psychiatric Initial Adult Assessment  Patient Identification: [name] MRN:  [id number] Date of Evaluation:  09/11/2023 Referral Source: PCP  Assessment:  [name] is a 42 y.o. female with a history of treatment resistant depression, caffeine overuse with caffeine induced insomnia, generalized anxiety disorder, chronic fatigue with vitamin d deficiency and vitamin b12 deficiency with macrocytosis, history of anorexia nervosa in unclear remission, meralgia paresthetica with recent weight gain, history of alcohol use disorder in sustained remission, history of tobacco use disorder in sustained remission who presents to [facility] via video conferencing for initial evaluation of depression.  Patient reported longstanding depression with many antidepressant trials that have all been listed as ineffective.  Throughout this period she also had disordered eating cognitions and prior 6-day per week exercise and significant restriction that likely limited the efficacy of at least serotonergic based medications previously and was on Wellbutrin at time of initial assessment.  It was only after sustaining an ankle fracture that they are working out decreased and lead to subsequent weight gain with height of 5 foot 11 and weight of 221 pounds at time of initial assessment which would preclude a diagnosis of anorexia based on BMI but will need to continue to monitor closely and recommend she get nutrition referral at PCP's office.  She had multiple vitamin deficiencies with vitamin D and B12 with macrocytosis which does provide more objective evidence of ongoing disordered eating.  This combined with caffeine overuse for many years of 3 Cokes euros per day with the last one being right before bedtime contributing to fatigue along with depression.  After the ankle fracture her normal coping technique was interrupted which was exercise and had heavier alcohol use typified by 1-2  bottles of wine per week for several months.  An ongoing contributing factor to the depression is husband's alcohol use and stress that is created in their marriage.  With her weight gain this led to left thigh pain consistent with meralgia paresthetica and recommended that she coordinate with PCP for further workup and intervention.  The insomnia also had restless legs and will try to get iron panel.  We will plan on discontinuing Wellbutrin given disordered eating and to titrate Cymbalta but may need to switch to Trintellix in the future.  She will contact insurer for therapist that is in network.  Follow-up in 1 month.  For safety, her acute risk factors for suicide are: Treatment resistant depression.  Her chronic risk factors for suicide are: Chronic mental illness, access to unsecured firearms.  Her protective factors are: Minor children living in the home, beloved pets, employed, married, actively seeking and engaging with mental health care, over the future, religious probation against suicide, no suicidal ideation in session today.  While future events cannot be fully predicted, she does not currently meet IVC criteria and can be continued as an outpatient.  Plan:  # Treatment resistant depression, recurrent Past medication trials: See medication trials below Status of problem: New to provider Interventions: -- Taper Wellbutrin XL to 150 mg once daily for 1 to 2 weeks then discontinue (d12/26/24, dc1/3/25) -- Titrate Cymbalta to 90 mg once daily (i12/26/24) -- Consider Trintellix, Lamictal -- Patient find a therapist that is in network  # Generalized anxiety disorder Past medication trials:  Status of problem: New to provider Interventions: -- Wellbutrin, Cymbalta, psychotherapy as above --Patient to cut back on caffeine use  # Caffeine overuse with caffeine induced insomnia with restless legs Past medication trials:  Status of problem: New to provider Interventions: -- Patient to  cut back on caffeine use --Coordinate with PCP for iron panel  # History of anorexia in unclear remission with vitamin D and B12 deficiency with macrocytosis Past medication trials:  Status of problem: New to provider Interventions: -- Continue vitamin D, B12 supplementation per PCP -- Coordinate with PCP for nutrition referral, magnesium, folate, phosphorus level  # Meralgia paresthetica Past medication trials:  Status of problem: New to provider Interventions: -- Coordinate with PCP for further workup and treatment  # History of alcohol use disorder in early remission Past medication trials:  Status of problem: New to provider Interventions: -- Continue to monitor for recurrence and encourage abstinence  # History of tobacco use disorder in sustained remission Past medication trials:  Status of problem: New to provider Interventions: -- Continue to encourage abstinence  Patient was given contact information for behavioral health clinic and was instructed to call 911 for emergencies.   Subjective:  Chief Complaint:  Chief Complaint  Patient presents with   Depression   Establish Care    History of Present Illness:  Has been on and off depression medication since 42 years old and wonders if she is treatment resistant with medication. On cymbalta and bupropion and recently stopped buspar from her GYN for some brief anxiety. Thinks it is genetic. Feels very blessed with 2 children and married for 23 years. Constant feeling of doom and gloom and self hatred (ugly, fat) and struggled with eating disorders with known b12 deficiency. Broke her ankle in March and impact her coping skill of running and working out. Ankle is better but now has hip pain.   Lives with husband, 2 children (son 3, daughter 43), brother and his son (bought land together), 4 dogs. 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 Used to workout for fun but reading or going  to kids' swim meets. Not enjoying like she used to and doesn't remember it not being this way. Difficulty staying asleep and not feeling rested in the morning, 6-7hrs. No issues falling asleep. Snoring only with colds, sleep study without sleep apnea. Legs feel in constant pain, laying down actually feels better but sitting has restless legs. Vivid dreams but no nightmares; infrequent. Coke zero is 3 per day with last after dinner. Tried to do intermittent fasting frequently but none since spring and will skip breakfast, will eat lunch and dinner with moderate portions; lunch is sandwich and crackers maybe yogurt. Dinner may be a vegetable and meat. Will eat salty snacks. Denies bingeing. Phobia of vomiting with none since childhood. Restriction primarily but is at her heaviest outside of pregnancy due to lack of working out previously. Wants to lose 30lbs. Had been working out 6 days a week and is trying to get back into that. Will think she needs to walk after consuming calories. Preoccupation with weight and body habitus. Does not own a scale, 5'11" and 221lbs. Concentration adequate. Fidgety. Struggling with guilt feelings constantly. Denies SI at present but having thought of going to sleep and not waking up would be a relief from the struggle. Cites faith and her children as protective factors and wants to keep living.   Chronic worry across multiple domains with impact on sleep and muscle tension. No panic attacks. Struggles in social situations without performance anxiety in job role. Does fine leaving home and going to store. No period of sleeplessness or excess energy. No hallucinations. No  paranoia.   Alcohol is 1-2x per month with at most 3 glasses of wine at a time, not full. No blackouts or physical illness. After breaking ankle was 1-2 bottles of wine per week and stopped in summer. No complicated withdrawal. Ongoing tension in marriage is husband likes to drink to get drunk and smoke cigarettes  while drinking. No tobacco products, quit early 20s. No other drugs lifelong.   Past Psychiatric History:  Diagnoses: anxiety and depression Medication trials: cymbalta (ineffective), bupropion (ineffective), buspar (ineffective), fluoxetine (ineffective), vilazodone (doesn't remember), effexor XR (ineffective), clonazepam, xanax, lexapro (ineffective), celexa (ineffective), zoloft (ineffective), abilify (ineffective) Previous psychiatrist/therapist: yes to both Hospitalizations: 1 night for depression in 2006 Suicide attempts: none SIB: none Hx of violence towards others: none Current access to guns: yes, unsecured and knows how to access Hx of trauma/abuse: none  Previous Psychotropic Medications: Yes   Substance Abuse History in the last 12 months:  No.  Past Medical History:  Past Medical History:  Diagnosis Date   Contraception management 10/07/2013   Mental disorder    depression    Past Surgical History:  Procedure Laterality Date   APPENDECTOMY      Family Psychiatric History: father with depression  Family History:  Family History  Problem Relation Age of Onset   Heart attack Paternal Grandfather    Heart disease Paternal Grandfather    Heart attack Paternal Grandmother    Heart disease Paternal Grandmother    Asthma Paternal Grandmother    Emphysema Paternal Grandmother    Heart disease Maternal Grandmother    Heart attack Maternal Grandmother    Asthma Maternal Grandmother    Emphysema Maternal Grandmother    Heart attack Maternal Grandfather    Heart disease Maternal Grandfather    Alzheimer's disease Maternal Grandfather    Heart disease Father    Heart attack Father     Social History:   Academic/Vocational: Research scientist (medical)  Social History   Socioeconomic History   Marital status: Married    Spouse name: Not on file   Number of children: 2   Years of education: Not on file   Highest education level: Not on file  Occupational History   Not on  file  Tobacco Use   Smoking status: Former    Types: Cigarettes   Smokeless tobacco: Never   Tobacco comments:    Quit in early 20s  Vaping Use   Vaping status: Never Used  Substance and Sexual Activity   Alcohol use: Yes    Comment: See psychiatry note from 09/11/2023   Drug use: Never   Sexual activity: Yes    Birth control/protection: Surgical    Comment: vasectomy  Other Topics Concern   Not on file  Social History Narrative   Not on file   Social Drivers of Health   Financial Resource Strain: Low Risk  (10/08/2021)   Overall Financial Resource Strain (CARDIA)    Difficulty of Paying Living Expenses: Not hard at all  Food Insecurity: No Food Insecurity (10/08/2021)   Hunger Vital Sign    Worried About Running Out of Food in the Last Year: Never true    Ran Out of Food in the Last Year: Never true  Transportation Needs: No Transportation Needs (10/08/2021)   PRAPARE - Administrator, Civil Service (Medical): No    Lack of Transportation (Non-Medical): No  Physical Activity: Sufficiently Active (10/08/2021)   Exercise Vital Sign    Days of Exercise per Week: 5 days  Minutes of Exercise per Session: 60 min  Stress: Stress Concern Present (10/08/2021)   [employer] of Occupational Health - Occupational Stress Questionnaire    Feeling of Stress : Very much  Social Connections: Socially Integrated (10/08/2021)   Social Connection and Isolation Panel [NHANES]    Frequency of Communication with Friends and Family: Three times a week    Frequency of Social Gatherings with Friends and Family: Once a week    Attends Religious Services: More than 4 times per year    Active Member of [employer] or Organizations: Yes    Attends Engineer, structural: More than 4 times per year    Marital Status: Married    Additional Social History: updated  Allergies:  No Known Allergies  Current Medications: Current Outpatient Medications  Medication Sig Dispense  Refill   BIOTIN PO Take by mouth daily.     buPROPion (WELLBUTRIN XL) 150 MG 24 hr tablet Take once daily for 1 to 2 weeks then discontinue. 14 tablet 0   Cyanocobalamin (VITAMIN B-12 IJ) Inject as directed.     DULoxetine (CYMBALTA) 30 MG capsule Take 3 capsules (90 mg total) by mouth daily. 90 capsule 1   VITAMIN D PO Take by mouth.     No current facility-administered medications for this visit.    ROS: Review of Systems  Constitutional:  Positive for appetite change and unexpected weight change.  Gastrointestinal:  Negative for constipation, diarrhea, nausea and vomiting.  Endocrine: Positive for heat intolerance. Negative for cold intolerance and polyphagia.  Musculoskeletal:  Positive for myalgias. Negative for arthralgias and back pain.  Skin:        No hair loss  Neurological:  Negative for dizziness and headaches.  Psychiatric/Behavioral:  Positive for dysphoric mood and sleep disturbance. Negative for decreased concentration, hallucinations, self-injury and suicidal ideas. The patient is nervous/anxious.     Objective:  Psychiatric Specialty Exam: Last menstrual period 08/11/2023.There is no height or weight on file to calculate BMI.  General Appearance: Casual, Fairly Groomed, and wearing glasses.  Appears stated age  Eye Contact:  Fair  Speech:  Clear and Coherent and verbose but interruptible  Volume:  Normal  Mood:   "I need help with my depression and I am worried it is treatment resistant"  Affect:  Appropriate, Congruent, Depressed, and reactive  Thought Content: Logical, Hallucinations: None, and Rumination on weight and body habitus  Suicidal Thoughts:  No  Homicidal Thoughts:  No  Thought Process:  Descriptions of Associations: Tangential  Orientation:  Full (Time, Place, and Person)    Memory: Grossly intact   Judgment:  Fair  Insight:  Fair  Concentration:  Concentration: Fair and Attention Span: Fair  Recall:  not formally assessed   Fund of  Knowledge: Good  Language: Fair  Psychomotor Activity:  Normal  Akathisia:  No  AIMS (if indicated): not done  Assets:  Communication Skills Desire for Improvement Financial Resources/Insurance Housing Intimacy Leisure Time Physical Health Resilience Social Support Talents/Skills Transportation Vocational/Educational  ADL's:  Intact  Cognition: WNL  Sleep:  Poor   PE: General: sits comfortably in view of camera; no acute distress  Pulm: no increased work of breathing on room air  MSK: all extremity movements appear intact  Neuro: no focal neurological deficits observed  Gait & Station: unable to assess by video    Metabolic Disorder Labs: No results found for: "HGBA1C", "MPG" No results found for: "PROLACTIN" No results found for: "CHOL", "TRIG", "HDL", "CHOLHDL", "VLDL", "LDLCALC"  Lab Results  Component Value Date   TSH 2.400 06/09/2023    Therapeutic Level Labs: No results found for: "LITHIUM" No results found for: "CBMZ" No results found for: "VALPROATE"  Screenings:  GAD-7    Flowsheet Row Office Visit from 08/26/2023 in [facility] for [facility] at [facility] Office Visit from 06/09/2023 in [facility] for [facility] at [facility] Office Visit from 10/08/2021 in [facility] for [facility] Healthcare at [facility] Office Visit from 07/10/2020 in [facility] for [facility] at [facility]  Total GAD-7 Score 1 21 14 2       PHQ2-9    Flowsheet Row Office Visit from 09/11/2023 in [facility] [place] Office Visit from 08/26/2023 in [facility] for [facility] at [facility] Office Visit from 06/09/2023 in [facility] for [facility] at [facility] Office Visit from 10/08/2021 in [facility] for [facility] Healthcare at [facility] Office Visit from 07/10/2020 in [facility] for [facility] at [facility]  PHQ-2 Total Score 6 2 6 6  0   PHQ-9 Total Score 17 6 17 13 2       Flowsheet Row Office Visit from 09/11/2023 in [facility] at [place]  C-SSRS RISK CATEGORY No Risk       Collaboration of Care: Collaboration of Care: Medication Management AEB as above, Primary Care Provider AEB as above, and Referral or follow-up with counselor/therapist AEB as above  Patient/Guardian was advised Release of Information must be obtained prior to any record release in order to collaborate their care with an outside provider. Patient/Guardian was advised if they have not already done so to contact the registration department to sign all necessary forms in order for [place] to release information regarding their care.   Consent: Patient/Guardian gives verbal consent for treatment and assignment of benefits for services provided during this visit. Patient/Guardian expressed understanding and agreed to proceed.   Televisit via video: I connected with [name] on 09/11/23 at 10:00 AM EST by a video enabled telemedicine application and verified that I am speaking with the correct person using two identifiers.  Location: Patient: home in [place] Provider: home office   I discussed the limitations of evaluation and management by telemedicine and the availability of in person appointments. The patient expressed understanding and agreed to proceed.  I discussed the assessment and treatment plan with the patient. The patient was provided an opportunity to ask questions and all were answered. The patient agreed with the plan and demonstrated an understanding of the instructions.   The patient was advised to call back or seek an in-person evaluation if the symptoms worsen or if the condition fails to improve as anticipated.  I provided 60 minutes dedicated to the care of this patient via video on the date of this encounter to include chart review, face-to-face time with the patient, medication  management/counseling, coordination of care with primary care provider.  Elsie Lincoln, MD 12/26/202412:38 PM

## 2023-09-25 ENCOUNTER — Encounter (HOSPITAL_COMMUNITY): Payer: Self-pay

## 2023-10-02 ENCOUNTER — Encounter: Payer: Self-pay | Admitting: Obstetrics & Gynecology

## 2023-10-06 ENCOUNTER — Ambulatory Visit: Payer: 59 | Admitting: Obstetrics & Gynecology

## 2023-10-06 ENCOUNTER — Encounter: Payer: Self-pay | Admitting: Obstetrics & Gynecology

## 2023-10-06 VITALS — BP 145/93 | HR 87

## 2023-10-06 DIAGNOSIS — N921 Excessive and frequent menstruation with irregular cycle: Secondary | ICD-10-CM

## 2023-10-06 DIAGNOSIS — N946 Dysmenorrhea, unspecified: Secondary | ICD-10-CM | POA: Diagnosis not present

## 2023-10-06 NOTE — Progress Notes (Signed)
Chief Complaint  Patient presents with   Pre-op Exam      43 y.o. G2P2 Patient's last menstrual period was 09/26/2023. The current method of family planning is COC recently off .  Outpatient Encounter Medications as of 10/06/2023  Medication Sig   BIOTIN PO Take by mouth daily.   Cyanocobalamin (VITAMIN B-12 IJ) Inject as directed.   DULoxetine (CYMBALTA) 30 MG capsule Take 3 capsules (90 mg total) by mouth daily.   VITAMIN D PO Take by mouth.   [DISCONTINUED] buPROPion (WELLBUTRIN XL) 150 MG 24 hr tablet Take once daily for 1 to 2 weeks then discontinue.   No facility-administered encounter medications on file as of 10/06/2023.    Subjective Pt with long history of heavy painful periods with clotting Managed with heavier estrogen COC but not managed with lower estrogen Has not had a scan since 2009 when she was pregnant   Past Medical History:  Diagnosis Date   Contraception management 10/07/2013   Mental disorder    depression    Past Surgical History:  Procedure Laterality Date   APPENDECTOMY      OB History     Gravida  2   Para  2   Term      Preterm      AB      Living  2      SAB      IAB      Ectopic      Multiple      Live Births  2           No Known Allergies  Social History   Socioeconomic History   Marital status: Married    Spouse name: Not on file   Number of children: 2   Years of education: Not on file   Highest education level: Not on file  Occupational History   Not on file  Tobacco Use   Smoking status: Former    Types: Cigarettes   Smokeless tobacco: Never   Tobacco comments:    Quit in early 73s  Vaping Use   Vaping status: Never Used  Substance and Sexual Activity   Alcohol use: Yes    Comment: See psychiatry note from 09/11/2023   Drug use: Never   Sexual activity: Yes    Birth control/protection: Surgical    Comment: vasectomy  Other Topics Concern   Not on file  Social History Narrative    Not on file   Social Drivers of Health   Financial Resource Strain: Low Risk  (10/08/2021)   Overall Financial Resource Strain (CARDIA)    Difficulty of Paying Living Expenses: Not hard at all  Food Insecurity: No Food Insecurity (10/08/2021)   Hunger Vital Sign    Worried About Running Out of Food in the Last Year: Never true    Ran Out of Food in the Last Year: Never true  Transportation Needs: No Transportation Needs (10/08/2021)   PRAPARE - Administrator, Civil Service (Medical): No    Lack of Transportation (Non-Medical): No  Physical Activity: Sufficiently Active (10/08/2021)   Exercise Vital Sign    Days of Exercise per Week: 5 days    Minutes of Exercise per Session: 60 min  Stress: Stress Concern Present (10/08/2021)   [employer] of Occupational Health - Occupational Stress Questionnaire    Feeling of Stress : Very much  Social Connections: Socially Integrated (10/08/2021)   Social Connection and Isolation Panel [NHANES]  Frequency of Communication with Friends and Family: Three times a week    Frequency of Social Gatherings with Friends and Family: Once a week    Attends Religious Services: More than 4 times per year    Active Member of [employer] or Organizations: Yes    Attends Engineer, structural: More than 4 times per year    Marital Status: Married    Family History  Problem Relation Age of Onset   Heart attack Paternal Grandfather    Heart disease Paternal Grandfather    Heart attack Paternal Grandmother    Heart disease Paternal Grandmother    Asthma Paternal Grandmother    Emphysema Paternal Grandmother    Heart disease Maternal Grandmother    Heart attack Maternal Grandmother    Asthma Maternal Grandmother    Emphysema Maternal Grandmother    Heart attack Maternal Grandfather    Heart disease Maternal Grandfather    Alzheimer's disease Maternal Grandfather    Heart disease Father    Heart attack Father     Medications:        Current Outpatient Medications:    BIOTIN PO, Take by mouth daily., Disp: , Rfl:    Cyanocobalamin (VITAMIN B-12 IJ), Inject as directed., Disp: , Rfl:    DULoxetine (CYMBALTA) 30 MG capsule, Take 3 capsules (90 mg total) by mouth daily., Disp: 90 capsule, Rfl: 1   VITAMIN D PO, Take by mouth., Disp: , Rfl:   Objective Blood pressure (!) 145/93, pulse 87, last menstrual period 09/26/2023.  WDWN NAD  Pertinent ROS No burning with urination, frequency or urgency No nausea, vomiting or diarrhea Nor fever chills or other constitutional symptoms   Labs or studies Reviewed labs etc    Impression + Management Plan: Diagnoses this Encounter::   ICD-10-CM   1. Menorrhagia with irregular cycle  N92.1 US PELVIS (TRANSABDOMINAL ONLY)    US Transvaginal Non-OB    2. Dysmenorrhea  N94.6 US PELVIS (TRANSABDOMINAL ONLY)    US Transvaginal Non-OB        Medications prescribed during  this encounter: No orders of the defined types were placed in this encounter.   Labs or Scans Ordered during this encounter: Orders Placed This Encounter  Procedures   US PELVIS (TRANSABDOMINAL ONLY)   US Transvaginal Non-OB      Follow up Return for GYN sono and see Dr Despina Hidden the same day as soon as can schedule.

## 2023-10-07 ENCOUNTER — Ambulatory Visit (INDEPENDENT_AMBULATORY_CARE_PROVIDER_SITE_OTHER): Payer: 59 | Admitting: Radiology

## 2023-10-07 ENCOUNTER — Ambulatory Visit (INDEPENDENT_AMBULATORY_CARE_PROVIDER_SITE_OTHER): Payer: 59 | Admitting: Obstetrics & Gynecology

## 2023-10-07 VITALS — BP 141/93 | HR 83

## 2023-10-07 DIAGNOSIS — N921 Excessive and frequent menstruation with irregular cycle: Secondary | ICD-10-CM | POA: Diagnosis not present

## 2023-10-07 DIAGNOSIS — Z30430 Encounter for insertion of intrauterine contraceptive device: Secondary | ICD-10-CM

## 2023-10-07 DIAGNOSIS — Z3202 Encounter for pregnancy test, result negative: Secondary | ICD-10-CM | POA: Diagnosis not present

## 2023-10-07 DIAGNOSIS — N946 Dysmenorrhea, unspecified: Secondary | ICD-10-CM

## 2023-10-07 LAB — POCT URINE PREGNANCY: Preg Test, Ur: NEGATIVE

## 2023-10-07 MED ORDER — LEVONORGESTREL 20 MCG/DAY IU IUD
1.0000 | INTRAUTERINE_SYSTEM | Freq: Once | INTRAUTERINE | Status: AC
  Administered 2023-10-07: 1 via INTRAUTERINE

## 2023-10-07 NOTE — Progress Notes (Signed)
Follow up appointment for results: Sonogram  Chief Complaint  Patient presents with   Follow-up    [place] today    Blood pressure (!) 141/93, pulse 83, last menstrual period 09/26/2023.  US PELVIC COMPLETE WITH TRANSVAGINAL Result Date: 10/07/2023 Images from the original result were not included.  ..an [employer] of [facility]trasound Medicine Technical sales engineer) accredited practice Center for [facility] @ [facility] [address] [place] [address] Ordering Provider: Lazaro Arms, MD                                       GYNECOLOGIC SONOGRAM [name] is a 43 y.o. G2P2 Patient's last menstrual period was 09/26/2023. for a pelvic sonogram for menorrhagia/dysmenorrhea. Uterus                      6.5 x 4.9 x 6.5 cm, Total uterine volume 107.83 cc    Uterine Length = 9.8 cm Anteverted uterus normal in size - symmetrical homogenous myometrial walls Endometrium          9.9 mm, symmetrical, avascular cavity and canal - no evidence of intracavitary soft tissue defects.  Homogenous cavity Right ovary             2.45 x 1.8 x 3.0 cm, mobile    Left ovary                3.3 x 2.1 x 2.0 cm, mobile Technician Comments: Gyn [place]:  TA and TV imaging obtained - Chaperone: Quila - vinyl probe cover used Anteverted uterus normal in size - symmetrical homogenous myometrial walls EEC = 9.9 mm,  avascular cavity and canal - no evidence of intracavitary soft tissue defects Homogenous cavity.  Normal ovaries - neg adnexal regions - neg CDS  -  no free fluid present Wylie Hail 10/07/2023 2:57 PM Clinical Impression and recommendations: I have reviewed the sonogram results above, combined with the patient's current clinical course, below are my impressions and any appropriate recommendations for management based on the sonographic findings. Uterus normal size shape and contour Endometrium normal for a menstruating woman Ovaries: normal size shape and morphology Lazaro Arms 10/07/2023 3:16 PM     Menorrhagia +  dysmenorrhea with normal sonogram We discussed options at her appt the other day  Recommend Mirena IUD placement prior to endometrial ablation  P agrees with that approach and will place today  IUD Insertion Procedure Note  Pre-operative Diagnosis: Menorrhagia                                            Dysmenorrhea  Post-operative Diagnosis: normal  Indications: contraception  Procedure Details  Urine pregnancy test was done today and result was negative.  The risks (including infection, bleeding, pain, and uterine perforation) and benefits of the procedure were explained to the patient and Written informed consent was obtained.    Cervix cleansed with Betadine. Uterus sounded to 8 cm. IUD inserted without difficulty. String visible and trimmed. Patient tolerated procedure well.  IUD Information: [name], Lot # [id number], Expiration date February 2027.  Condition: Stable  Complications: None  Plan:  The patient was advised to call for any fever or for prolonged or severe pain or bleeding. She was advised to use  OTC ibuprofen as needed for mild to moderate pain.   Attending Physician Documentation: I placed the IUD  MEDS ordered this encounter: Meds ordered this encounter  Medications   levonorgestrel (MIRENA) 20 MCG/DAY IUD 1 each    Orders for this encounter: Orders Placed This Encounter  Procedures   POCT urine pregnancy    Impression + Management Plan   ICD-10-CM   1. Encounter for IUD insertion  Z30.430 POCT urine pregnancy    2. Menorrhagia with irregular cycle  N92.1     3. Dysmenorrhea  N94.6       Follow Up: Return if symptoms worsen or fail to improve.     All questions were answered.  Past Medical History:  Diagnosis Date   Contraception management 10/07/2013   Mental disorder    depression    Past Surgical History:  Procedure Laterality Date   APPENDECTOMY      OB History     Gravida  2   Para  2   Term      Preterm      AB       Living  2      SAB      IAB      Ectopic      Multiple      Live Births  2           No Known Allergies  Social History   Socioeconomic History   Marital status: Married    Spouse name: Not on file   Number of children: 2   Years of education: Not on file   Highest education level: Not on file  Occupational History   Not on file  Tobacco Use   Smoking status: Former    Types: Cigarettes   Smokeless tobacco: Never   Tobacco comments:    Quit in early 53s  Vaping Use   Vaping status: Never Used  Substance and Sexual Activity   Alcohol use: Yes    Comment: See psychiatry note from 09/11/2023   Drug use: Never   Sexual activity: Yes    Birth control/protection: Surgical    Comment: vasectomy  Other Topics Concern   Not on file  Social History Narrative   Not on file   Social Drivers of Health   Financial Resource Strain: Low Risk  (10/08/2021)   Overall Financial Resource Strain (CARDIA)    Difficulty of Paying Living Expenses: Not hard at all  Food Insecurity: No Food Insecurity (10/08/2021)   Hunger Vital Sign    Worried About Running Out of Food in the Last Year: Never true    Ran Out of Food in the Last Year: Never true  Transportation Needs: No Transportation Needs (10/08/2021)   PRAPARE - Administrator, Civil Service (Medical): No    Lack of Transportation (Non-Medical): No  Physical Activity: Sufficiently Active (10/08/2021)   Exercise Vital Sign    Days of Exercise per Week: 5 days    Minutes of Exercise per Session: 60 min  Stress: Stress Concern Present (10/08/2021)   [employer] of Occupational Health - Occupational Stress Questionnaire    Feeling of Stress : Very much  Social Connections: Socially Integrated (10/08/2021)   Social Connection and Isolation Panel [NHANES]    Frequency of Communication with Friends and Family: Three times a week    Frequency of Social Gatherings with Friends and Family: Once a week     Attends Religious Services: More than 4 times  per year    Active Member of Clubs or Organizations: Yes    Attends Engineer, structural: More than 4 times per year    Marital Status: Married    Family History  Problem Relation Age of Onset   Heart attack Paternal Grandfather    Heart disease Paternal Grandfather    Heart attack Paternal Grandmother    Heart disease Paternal Grandmother    Asthma Paternal Grandmother    Emphysema Paternal Grandmother    Heart disease Maternal Grandmother    Heart attack Maternal Grandmother    Asthma Maternal Grandmother    Emphysema Maternal Grandmother    Heart attack Maternal Grandfather    Heart disease Maternal Grandfather    Alzheimer's disease Maternal Grandfather    Heart disease Father    Heart attack Father

## 2023-10-07 NOTE — Progress Notes (Signed)
Gyn [place]:  TA and TV imaging obtained - Chaperone: Quila - vinyl probe cover used Anteverted uterus normal in size - symmetrical homogenous myometrial walls EEC = 9.9 mm,  avascular cavity and canal - no evidence of intracavitary soft tissue defects  Homogenous cavity.  Normal ovaries - neg adnexal regions - neg CDS  -  no free fluid present

## 2023-10-13 ENCOUNTER — Telehealth (HOSPITAL_COMMUNITY): Payer: 59 | Admitting: Psychiatry

## 2023-10-13 ENCOUNTER — Encounter (HOSPITAL_COMMUNITY): Payer: Self-pay | Admitting: Psychiatry

## 2023-10-13 DIAGNOSIS — Z8659 Personal history of other mental and behavioral disorders: Secondary | ICD-10-CM

## 2023-10-13 DIAGNOSIS — E559 Vitamin D deficiency, unspecified: Secondary | ICD-10-CM | POA: Diagnosis not present

## 2023-10-13 DIAGNOSIS — E538 Deficiency of other specified B group vitamins: Secondary | ICD-10-CM

## 2023-10-13 DIAGNOSIS — F411 Generalized anxiety disorder: Secondary | ICD-10-CM | POA: Diagnosis not present

## 2023-10-13 DIAGNOSIS — F339 Major depressive disorder, recurrent, unspecified: Secondary | ICD-10-CM | POA: Diagnosis not present

## 2023-10-13 MED ORDER — DULOXETINE HCL 60 MG PO CPEP: 120.0000 mg | ORAL_CAPSULE | Freq: Every day | ORAL | 2 refills | Status: DC

## 2023-10-13 NOTE — Patient Instructions (Addendum)
We titrated the Cymbalta today to 120 mg once daily.  This should help with the anxiety and depression along with the chronic pain.  Keep aiming for consistency with your meals and be mindful of what is driving the need to work out.  Remember to follow through with the nutrition referral from your PCP when they make it.  As we discussed, the most effective couple/marriage counseling from available data is the Gottman's method and you can find practitioners through [employer].  You may also find benefit from reading " the 7 principles of making marriage work" by the [employer].

## 2023-10-13 NOTE — Progress Notes (Signed)
BH MD Outpatient Follow Up Note  Patient Identification: [name] MRN:  [id number] Date of Evaluation:  10/13/2023 Referral Source: PCP  Assessment:  [name] is an established patient presenting for follow-up video conferencing appointment.  Today, 10/13/23, patient reports no worsening of mood with discontinuation of Wellbutrin and has been less anxious and sleeping slightly better.  This does coincide with decreasing her Coke 0 intake to 1.5/day.  She still needs to get a nutrition referral from her PCP and has doubled up on vitamin D and B12 supplements as they were still at the low end of normal with supplementation.  We will need to closely monitor amount of exercise and dietary intake as still 2 meals per day and exercising every day along with drinking 1 gallon of water per day.  Cymbalta does appear to be working and helping with chronic pain while she gets other treatments done for her hip.  We will titrate today.  She will contact insurer for therapist that is in network and may also look into Gottman's treatment for marriage counseling.  Follow-up in 1 month.  For safety, her acute risk factors for suicide are: Treatment resistant depression.  Her chronic risk factors for suicide are: Chronic mental illness, access to unsecured firearms.  Her protective factors are: Minor children living in the home, beloved pets, employed, married, actively seeking and engaging with mental health care, over the future, religious probation against suicide, no suicidal ideation in session today.  While future events cannot be fully predicted, she does not currently meet IVC criteria and can be continued as an outpatient.  Identifying Information: [name] is a 43 y.o. 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 Past medication trials:  Status of problem: Improving Interventions: -- Patient to cut back on caffeine use  # History of anorexia in unclear remission with vitamin D and B12 deficiency with macrocytosis Past medication trials:  Status of problem: Chronic and stable Interventions: -- Continue vitamin D, B12 supplementation per PCP -- Coordinate with PCP for nutrition referral  # Meralgia paresthetica Past medication trials:  Status of problem: Chronic and stable Interventions: -- Continue with PT  # History of alcohol use disorder in early remission Past medication trials:  Status of problem: In remission Interventions: -- Continue to monitor for recurrence and encourage abstinence  # History of tobacco use disorder in sustained remission Past medication trials:  Status of problem: In remission Interventions: -- Continue to encourage abstinence  Patient was given contact information for behavioral health clinic and was instructed to call  911 for emergencies.   Subjective:  Chief Complaint:  Chief Complaint  Patient presents with   Anxiety   Depression   Follow-up   Stress    History of Present Illness:  Things have been much better since last appointment. Her liver evaluation was good in that it was just fatty liver which should improve with diet and exercise. Coming off the wellbutrin had zero side effects. Will continue vitamin d and b12 as they were both at the low end of normal. Tolerating the cymbalta well. Has started acoustic wave therapy for her right hip and the pain is a lot better after 2 massages. Legs are still restless though and still noticing some meralgia paresthetica. Has been able to work out 8 days in a row now but the good kind of sore. Anxiety and insomnia got a little better with stopping the wellbutrin. Has decreased coke zero to 1.5 per day and trying not to have around dinner. Has increased water intake as well with 1 gallon per day. Lunch is typically cheese crackers, yogurt with granola, or a nut with carrots. Dinner typically full plate. Would like to go on and try titration of cymbalta as it is helping and tired of the depression. Denies SI.    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 Medication trials: cymbalta (ineffective when combined with Wellbutrin), bupropion (ineffective), buspar (ineffective), fluoxetine (ineffective), vilazodone (doesn't remember), effexor XR (ineffective), clonazepam, xanax, lexapro (ineffective), celexa (ineffective), zoloft (ineffective), abilify (ineffective) Previous  psychiatrist/therapist: yes to both Hospitalizations: 1 night for depression in 2006 Suicide attempts: none SIB: none Hx of violence towards others: none Current access to guns: yes, unsecured and knows how to access Hx of trauma/abuse: none Substance use: Alcohol is 1-2x per month with at most 3 glasses of wine at a time, not full. No blackouts or physical illness.  Previous Psychotropic Medications: Yes   Substance Abuse History in the last 12 months:  No.  Past Medical History:  Past Medical History:  Diagnosis Date   Contraception management 10/07/2013   Mental disorder    depression    Past Surgical History:  Procedure Laterality Date   APPENDECTOMY      Family Psychiatric History: father with depression  Family History:  Family History  Problem Relation Age of Onset   Heart attack Paternal Grandfather    Heart disease Paternal Grandfather    Heart attack Paternal Grandmother    Heart disease Paternal Grandmother    Asthma Paternal Grandmother    Emphysema Paternal Grandmother    Heart disease Maternal Grandmother    Heart attack Maternal Grandmother    Asthma Maternal Grandmother    Emphysema Maternal Grandmother    Heart attack Maternal Grandfather    Heart disease Maternal Grandfather    Alzheimer's disease Maternal Grandfather    Heart disease Father    Heart attack Father     Social History:   Academic/Vocational: Research scientist (medical)  Social History   Socioeconomic History   Marital status: Married    Spouse name: Not on file   Number of children: 2   Years of education: Not on file   Highest education level: Not on file  Occupational History  Not on file  Tobacco Use   Smoking status: Former    Types: Cigarettes   Smokeless tobacco: Never   Tobacco comments:    Quit in early 29s  Vaping Use   Vaping status: Never Used  Substance and Sexual Activity   Alcohol use: Yes    Comment: See psychiatry note from 09/11/2023   Drug use: Never   Sexual  activity: Yes    Birth control/protection: Surgical    Comment: vasectomy  Other Topics Concern   Not on file  Social History Narrative   Not on file   Social Drivers of Health   Financial Resource Strain: Low Risk  (10/08/2021)   Overall Financial Resource Strain (CARDIA)    Difficulty of Paying Living Expenses: Not hard at all  Food Insecurity: No Food Insecurity (10/08/2021)   Hunger Vital Sign    Worried About Running Out of Food in the Last Year: Never true    Ran Out of Food in the Last Year: Never true  Transportation Needs: No Transportation Needs (10/08/2021)   PRAPARE - Administrator, Civil Service (Medical): No    Lack of Transportation (Non-Medical): No  Physical Activity: Sufficiently Active (10/08/2021)   Exercise Vital Sign    Days of Exercise per Week: 5 days    Minutes of Exercise per Session: 60 min  Stress: Stress Concern Present (10/08/2021)   [employer] of Occupational Health - Occupational Stress Questionnaire    Feeling of Stress : Very much  Social Connections: Socially Integrated (10/08/2021)   Social Connection and Isolation Panel [NHANES]    Frequency of Communication with Friends and Family: Three times a week    Frequency of Social Gatherings with Friends and Family: Once a week    Attends Religious Services: More than 4 times per year    Active Member of [employer] or Organizations: Yes    Attends Engineer, structural: More than 4 times per year    Marital Status: Married    Additional Social History: updated  Allergies:  No Known Allergies  Current Medications: Current Outpatient Medications  Medication Sig Dispense Refill   BIOTIN PO Take by mouth daily.     Cyanocobalamin (VITAMIN B-12 IJ) Inject as directed.     DULoxetine (CYMBALTA) 60 MG capsule Take 2 capsules (120 mg total) by mouth daily. 60 capsule 2   VITAMIN D PO Take by mouth.     No current facility-administered medications for this visit.     ROS: Review of Systems  Constitutional:  Positive for appetite change and unexpected weight change.  Gastrointestinal:  Negative for constipation, diarrhea, nausea and vomiting.  Endocrine: Positive for heat intolerance. Negative for cold intolerance and polyphagia.  Musculoskeletal:  Positive for myalgias. Negative for arthralgias and back pain.  Skin:        No hair loss  Neurological:  Negative for dizziness and headaches.  Psychiatric/Behavioral:  Positive for dysphoric mood and sleep disturbance. Negative for decreased concentration, hallucinations, self-injury and suicidal ideas. The patient is nervous/anxious.     Objective:  Psychiatric Specialty Exam: Last menstrual period 09/26/2023.There is no height or weight on file to calculate BMI.  General Appearance: Casual, Fairly Groomed, and wearing glasses.  Appears stated age  Eye Contact:  Fair  Speech:  Clear and Coherent and Normal Rate  Volume:  Normal  Mood:   "Things been good but still stressed"  Affect:  Appropriate, Congruent, Depressed, and reactive; brighter than initial appointment  Thought  Content: Logical, Hallucinations: None, and Rumination on weight and body habitus  Suicidal Thoughts:  No  Homicidal Thoughts:  No  Thought Process:  Descriptions of Associations: Tangential  Orientation:  Full (Time, Place, and Person)    Memory: Grossly intact   Judgment:  Fair  Insight:  Fair  Concentration:  Concentration: Fair and Attention Span: Fair  Recall:  not formally assessed   Fund of Knowledge: Good  Language: Fair  Psychomotor Activity:  Normal  Akathisia:  No  AIMS (if indicated): not done  Assets:  Communication Skills Desire for Improvement Financial Resources/Insurance Housing Intimacy Leisure Time Physical Health Resilience Social Support Talents/Skills Transportation Vocational/Educational  ADL's:  Intact  Cognition: WNL  Sleep:  Poor but improving   PE: General: sits comfortably in  view of camera; no acute distress  Pulm: no increased work of breathing on room air  MSK: all extremity movements appear intact  Neuro: no focal neurological deficits observed  Gait & Station: unable to assess by video    Metabolic Disorder Labs: No results found for: "HGBA1C", "MPG" No results found for: "PROLACTIN" No results found for: "CHOL", "TRIG", "HDL", "CHOLHDL", "VLDL", "LDLCALC" Lab Results  Component Value Date   TSH 2.400 06/09/2023    Therapeutic Level Labs: No results found for: "LITHIUM" No results found for: "CBMZ" No results found for: "VALPROATE"  Screenings:  GAD-7    Flowsheet Row Office Visit from 08/26/2023 in [facility] at [facility] Office Visit from 06/09/2023 in [facility] for [facility] at [facility] Office Visit from 10/08/2021 in [facility] for [facility] at [facility] Office Visit from 07/10/2020 in [facility] at [facility]  Total GAD-7 Score 1 21 14 2       PHQ2-9    Flowsheet Row Office Visit from 09/11/2023 in [facility] at [place] Office Visit from 08/26/2023 in [facility] for [facility] at [facility] Office Visit from 06/09/2023 in [facility] for [facility] at [facility] Office Visit from 10/08/2021 in [facility] for [facility] at [facility] Office Visit from 07/10/2020 in [facility] for [facility] at [facility]  PHQ-2 Total Score 6 2 6 6  0  PHQ-9 Total Score 17 6 17 13 2       Flowsheet Row Office Visit from 09/11/2023 in [facility] at [place]  C-SSRS RISK CATEGORY No Risk       Collaboration of Care: Collaboration of Care: Medication Management AEB as above, Primary Care Provider AEB as above, and Referral or follow-up with counselor/therapist AEB as above  Patient/Guardian was advised Release of Information must  be obtained prior to any record release in order to collaborate their care with an outside provider. Patient/Guardian was advised if they have not already done so to contact the registration department to sign all necessary forms in order for [place] to release information regarding their care.   Consent: Patient/Guardian gives verbal consent for treatment and assignment of benefits for services provided during this visit. Patient/Guardian expressed understanding and agreed to proceed.   Televisit via video: I connected with ALFONSO SHACKETT on 10/13/23 at  3:30 PM EST by a video enabled telemedicine application and verified that I am speaking with the correct person using two identifiers.  Location: Patient: home in [place] Provider: home office   I discussed the limitations of evaluation and management by telemedicine and  the availability of in person appointments. The patient expressed understanding and agreed to proceed.  I discussed the assessment and treatment plan with the patient. The patient was provided an opportunity to ask questions and all were answered. The patient agreed with the plan and demonstrated an understanding of the instructions.   The patient was advised to call back or seek an in-person evaluation if the symptoms worsen or if the condition fails to improve as anticipated.  I provided 30 minutes dedicated to the care of this patient via video on the date of this encounter to include chart review, face-to-face time with the patient, medication management/counseling, coordination of care with primary care provider.  Elsie Lincoln, MD 1/27/20254:27 PM

## 2023-11-05 ENCOUNTER — Other Ambulatory Visit (HOSPITAL_COMMUNITY): Payer: Self-pay | Admitting: Psychiatry

## 2023-11-05 DIAGNOSIS — F411 Generalized anxiety disorder: Secondary | ICD-10-CM

## 2023-11-05 DIAGNOSIS — F339 Major depressive disorder, recurrent, unspecified: Secondary | ICD-10-CM

## 2023-11-11 ENCOUNTER — Telehealth (INDEPENDENT_AMBULATORY_CARE_PROVIDER_SITE_OTHER): Payer: 59 | Admitting: Psychiatry

## 2023-11-11 ENCOUNTER — Encounter (HOSPITAL_COMMUNITY): Payer: Self-pay | Admitting: Psychiatry

## 2023-11-11 DIAGNOSIS — Z789 Other specified health status: Secondary | ICD-10-CM

## 2023-11-11 DIAGNOSIS — F15982 Other stimulant use, unspecified with stimulant-induced sleep disorder: Secondary | ICD-10-CM | POA: Diagnosis not present

## 2023-11-11 DIAGNOSIS — Z8659 Personal history of other mental and behavioral disorders: Secondary | ICD-10-CM

## 2023-11-11 DIAGNOSIS — E559 Vitamin D deficiency, unspecified: Secondary | ICD-10-CM

## 2023-11-11 DIAGNOSIS — F339 Major depressive disorder, recurrent, unspecified: Secondary | ICD-10-CM

## 2023-11-11 DIAGNOSIS — E538 Deficiency of other specified B group vitamins: Secondary | ICD-10-CM | POA: Diagnosis not present

## 2023-11-11 DIAGNOSIS — F411 Generalized anxiety disorder: Secondary | ICD-10-CM

## 2023-11-11 NOTE — Progress Notes (Signed)
 BH MD Outpatient Follow Up Note  Patient Identification: [name] MRN:  [id number] Date of Evaluation:  11/11/2023 Referral Source: PCP  Assessment:  [name] is an established patient presenting for follow-up video conferencing appointment.  Today, 11/11/23, patient reports stability of mood despite having another injury limiting her ability to work out during the week.  Cautioned on rapid escalation of physical activity after an injury with increased risk of new injuries as well as listening to her body when injuries do occur.  This is a sign of efficacy of Cymbalta and nutritional supplementation that she has not had a decompensation with the lack of exercise.  This is still related to disordered eating cognitions and has prevented her from getting a nutrition referral yet.  As a sign of caffeine is impact on the body, taking magnesium at night as improved sleep and restless legs; her Coke 0 intake is still 2/day but no longer dinnertime.   We will need to closely monitor amount of exercise and dietary intake as still 2 meals per day and exercising every day along with drinking 1 gallon of water per day.  She will contact insurer for therapist that is in network and may also look into [facility] for marriage counseling.  Follow-up in 1 month.  For safety, her acute risk factors for suicide are: Treatment resistant depression.  Her chronic risk factors for suicide are: Chronic mental illness, access to unsecured firearms.  Her protective factors are: Minor children living in the home, beloved pets, employed, married, actively seeking and engaging with mental health care, over the future, religious probation against suicide, no suicidal ideation in session today.  While future events cannot be fully predicted, she does not currently meet IVC criteria and can be continued as an outpatient.  Identifying Information: [name] is a 43 y.o. female with a history of  treatment resistant depression, caffeine overuse with caffeine induced insomnia, generalized anxiety disorder, chronic fatigue with vitamin d deficiency and vitamin b12 deficiency with macrocytosis, history of anorexia nervosa in unclear remission, meralgia paresthetica with recent weight gain, history of alcohol use disorder in sustained remission, history of tobacco use disorder in sustained remission who presented to [facility] via video conferencing for initial evaluation of depression on 09/11/2023; please see that note for full case formulation. No worsening of mood with discontinuation of Wellbutrin and was less anxious and sleeping slightly better.  Plan:  # Treatment resistant depression, recurrent Past medication trials: See medication trials below Status of problem: Improving Interventions: -- Continue Cymbalta 120 mg once daily (i12/26/24, i1/27/25) -- Consider Trintellix, Lamictal -- Patient find a therapist that is in network  # Generalized anxiety disorder Past medication trials:  Status of problem: Improving Interventions: -- Cymbalta, psychotherapy as above --Patient to cut back on caffeine use  # Caffeine overuse with caffeine induced insomnia with restless legs Past medication trials:  Status of problem: Improving Interventions: -- Patient to cut back on caffeine use -- continue magnesium nightly  # History of anorexia in unclear remission with vitamin D and B12 deficiency with macrocytosis Past medication trials:  Status of problem: Chronic and stable Interventions: -- Continue vitamin D, B12 supplementation per PCP -- Coordinate with PCP for nutrition referral  # Meralgia paresthetica  left foot tendinitis Past medication trials:  Status of problem: Chronic and stable Interventions: -- Continue with PT and orthopedics  # History of alcohol use disorder in early remission Past medication trials:  Status  of problem: In  remission Interventions: -- Continue to monitor for recurrence and encourage abstinence  # History of tobacco use disorder in sustained remission Past medication trials:  Status of problem: In remission Interventions: -- Continue to encourage abstinence  Patient was given contact information for behavioral health clinic and was instructed to call 911 for emergencies.   Subjective:  Chief Complaint:  Chief Complaint  Patient presents with   Anxiety   Depression   Follow-up   Stress   Eating Disorder    History of Present Illness: Not much has changed since last appointment, now has tendonitis in her left foot and has been in a boot for the last two weeks. Will see orthopedic doctor tomorrow and hopefully will get cleared. Had been after getting back into her old workout routine. Has been a struggle because that has been her outlet but thinks she has handled it better than last time due to medication and b12 shots. Does also note improvement with changing sunlight and getting outdoors. Did get workup for the new tendonitis and will see results tomorrow at follow up. Coke zero still 2 per day and trying not to have around dinner but improving sleep with magnesium. Water intake still 1 gallon per day. Legs are still restless though but magnesium is helpful for it and still noticing some meralgia paresthetica.  Lunch is typically cheese crackers, yogurt with granola, or a nut with carrots. Dinner typically full plate. Encouraged getting nutrition referral but still hesitant to do this with stressors of weight number and potential changes to her diet. Denies SI.    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 Medication trials: cymbalta (ineffective when combined with Wellbutrin), bupropion (ineffective), buspar (ineffective), fluoxetine (ineffective), vilazodone (doesn't remember), effexor XR (ineffective), clonazepam, xanax, lexapro (ineffective), celexa (ineffective), zoloft (ineffective), abilify (ineffective) Previous psychiatrist/therapist: yes to both Hospitalizations: 1 night for depression in 2006 Suicide attempts: none SIB: none Hx of violence towards others: none Current access to guns: yes, unsecured and knows how to access Hx of trauma/abuse: none Substance use: Alcohol is 1-2x per month with at most 3 glasses of wine at a time, not full. No blackouts or physical illness.  Past Medical History:  Past Medical History:  Diagnosis Date   Contraception management 10/07/2013   Mental disorder    depression    Family Psychiatric History: father with depression  Social History:   Academic/Vocational: Research scientist (medical)  Social History   Socioeconomic History   Marital status: Married    Spouse name: Not on file   Number of children: 2   Years of education: Not on file   Highest education level: Not on file  Occupational History   Not on file  Tobacco Use   Smoking status: Former    Types: Cigarettes   Smokeless tobacco: Never   Tobacco comments:    Quit in early 20s  Vaping Use   Vaping status: Never Used  Substance and Sexual Activity   Alcohol use: Yes    Comment: See psychiatry note from 09/11/2023   Drug use: Never   Sexual activity: Yes    Birth control/protection: Surgical    Comment: vasectomy  Other Topics Concern  Not on file  Social History Narrative   Not on file   Social Drivers of Health   Financial Resource Strain: Low Risk  (10/08/2021)   Overall Financial Resource Strain (CARDIA)    Difficulty of Paying Living Expenses: Not hard at all  Food  Insecurity: No Food Insecurity (10/08/2021)   Hunger Vital Sign    Worried About Running Out of Food in the Last Year: Never true    Ran Out of Food in the Last Year: Never true  Transportation Needs: No Transportation Needs (10/08/2021)   PRAPARE - Administrator, Civil Service (Medical): No    Lack of Transportation (Non-Medical): No  Physical Activity: Sufficiently Active (10/08/2021)   Exercise Vital Sign    Days of Exercise per Week: 5 days    Minutes of Exercise per Session: 60 min  Stress: Stress Concern Present (10/08/2021)   [employer] of Occupational Health - Occupational Stress Questionnaire    Feeling of Stress : Very much  Social Connections: Socially Integrated (10/08/2021)   Social Connection and Isolation Panel [NHANES]    Frequency of Communication with Friends and Family: Three times a week    Frequency of Social Gatherings with Friends and Family: Once a week    Attends Religious Services: More than 4 times per year    Active Member of [employer] or Organizations: Yes    Attends Engineer, structural: More than 4 times per year    Marital Status: Married    Additional Social History: updated  Allergies:  No Known Allergies  Current Medications: Current Outpatient Medications  Medication Sig Dispense Refill   MAGNESIUM GLUCONATE PO Take by mouth at bedtime.     BIOTIN PO Take by mouth daily.     Cyanocobalamin (VITAMIN B-12 IJ) Inject as directed.     DULoxetine (CYMBALTA) 60 MG capsule Take 2 capsules (120 mg total) by mouth daily. 60 capsule 2   VITAMIN D PO Take by mouth.     No current facility-administered medications for this visit.    ROS: Review of Systems  Constitutional:  Positive for appetite change and unexpected weight change.  Gastrointestinal:  Negative for constipation, diarrhea, nausea and vomiting.  Endocrine: Positive for heat intolerance. Negative for cold intolerance and polyphagia.  Musculoskeletal:  Positive for  myalgias. Negative for arthralgias and back pain.  Skin:        No hair loss  Neurological:  Negative for dizziness and headaches.  Psychiatric/Behavioral:  Positive for dysphoric mood and sleep disturbance. Negative for decreased concentration, hallucinations, self-injury and suicidal ideas. The patient is nervous/anxious.     Objective:  Psychiatric Specialty Exam: There were no vitals taken for this visit.There is no height or weight on file to calculate BMI.  General Appearance: Casual, Fairly Groomed, and wearing glasses.  Appears stated age  Eye Contact:  Fair  Speech:  Clear and Coherent and Normal Rate  Volume:  Normal  Mood:   "Doing okay considering got another injury"  Affect:  Appropriate, Congruent, Depressed, and reactive; brighter than initial appointment  Thought Content: Logical, Hallucinations: None, and Rumination on weight and body habitus  Suicidal Thoughts:  No  Homicidal Thoughts:  No  Thought Process:  Descriptions of Associations: Tangential but improving linearity  Orientation:  Full (Time, Place, and Person)    Memory: Grossly intact   Judgment:  Fair  Insight:  Fair  Concentration:  Concentration: Fair and Attention Span: Fair  Recall:  not formally assessed  Fund of Knowledge: Good  Language: Fair  Psychomotor Activity:  Normal  Akathisia:  No  AIMS (if indicated): not done  Assets:  Communication Skills Desire for Improvement Financial Resources/Insurance Housing Intimacy Leisure Time Physical Health Resilience Social Support Talents/Skills Transportation Vocational/Educational  ADL's:  Intact  Cognition: WNL  Sleep:  Poor but improving   PE: General: sits comfortably in view of camera; no acute distress  Pulm: no increased work of breathing on room air  MSK: all extremity movements appear intact  Neuro: no focal neurological deficits observed  Gait & Station: unable to assess by video    Metabolic Disorder Labs: No results found  for: "HGBA1C", "MPG" No results found for: "PROLACTIN" No results found for: "CHOL", "TRIG", "HDL", "CHOLHDL", "VLDL", "LDLCALC" Lab Results  Component Value Date   TSH 2.400 06/09/2023    Therapeutic Level Labs: No results found for: "LITHIUM" No results found for: "CBMZ" No results found for: "VALPROATE"  Screenings:  GAD-7    Flowsheet Row Office Visit from 08/26/2023 in [facility] at [facility] Office Visit from 06/09/2023 in [facility] for [facility] at [facility] Office Visit from 10/08/2021 in [facility] at [facility] Office Visit from 07[facility]10/2020 in [facility] at [facility]  Total GAD-7 Score 1 21 14 2       PHQ2-9    Flowsheet Row Office Visit from 09/11/2023 in [facility] at [place] Office Visit from 08/26/2023 in [facility] for [facility] at [facility] Office Visit from 06/09/2023 in [facility] for [facility] at [facility] Office Visit from 10/08/2021 in [facility] for [facility] at [facility] Office Visit from 07/10/2020 in [facility] at [facility]  PHQ-2 Total Score 6 2 6 6  0  PHQ-9 Total Score 17 6 17 13 2       Flowsheet Row Office Visit from 09/11/2023 in [facility] at [place]  C-SSRS RISK CATEGORY No Risk       Collaboration of Care: Collaboration of Care: Medication Management AEB as above, Primary Care Provider AEB as above, and Referral or follow-up with counselor/therapist AEB as above  Patient/Guardian was advised Release of Information must be obtained prior to any record release in order to collaborate their care with an outside provider. Patient/Guardian was advised if they have not already done so to contact the registration department to sign all necessary forms in order for [place] to release information  regarding their care.   Consent: Patient/Guardian gives verbal consent for treatment and assignment of benefits for services provided during this visit. Patient/Guardian expressed understanding and agreed to proceed.   Televisit via video: I connected with PADEN KURAS on 11/11/23 at  3:30 PM EST by a video enabled telemedicine application and verified that I am speaking with the correct person using two identifiers.  Location: Patient: home in [place] Provider: home office   I discussed the limitations of evaluation and management by telemedicine and the availability of in person appointments. The patient expressed understanding and agreed to proceed.  I discussed the assessment and treatment plan with the patient. The patient was provided an opportunity to ask questions and all were answered. The patient agreed with the plan and demonstrated an understanding of the instructions.   The patient was advised to call back or seek an in-person evaluation if the symptoms worsen  or if the condition fails to improve as anticipated.  I provided 30 minutes dedicated to the care of this patient via video on the date of this encounter to include chart review, face-to-face time with the patient, medication management/counseling, coordination of care with primary care provider.  Elsie Lincoln, MD 2/25/20254:07 PM

## 2023-11-11 NOTE — Patient Instructions (Signed)
 We did not make any medication changes today.  Taking the magnesium will likely help the restless legs at night in addition to improving sleep.  Keep trying make steady cut backs on the amount of caffeine you are consuming daily as these things are related.  Keep considering getting that nutrition referral from your primary care provider and doing blind weights whenever you have to be weighed moving forward.

## 2023-12-09 ENCOUNTER — Encounter (HOSPITAL_COMMUNITY): Payer: Self-pay | Admitting: Psychiatry

## 2023-12-09 ENCOUNTER — Telehealth (INDEPENDENT_AMBULATORY_CARE_PROVIDER_SITE_OTHER): Payer: 59 | Admitting: Psychiatry

## 2023-12-09 DIAGNOSIS — F411 Generalized anxiety disorder: Secondary | ICD-10-CM

## 2023-12-09 DIAGNOSIS — Z8659 Personal history of other mental and behavioral disorders: Secondary | ICD-10-CM | POA: Diagnosis not present

## 2023-12-09 DIAGNOSIS — F339 Major depressive disorder, recurrent, unspecified: Secondary | ICD-10-CM | POA: Diagnosis not present

## 2023-12-09 DIAGNOSIS — F15982 Other stimulant use, unspecified with stimulant-induced sleep disorder: Secondary | ICD-10-CM

## 2023-12-09 DIAGNOSIS — E538 Deficiency of other specified B group vitamins: Secondary | ICD-10-CM

## 2023-12-09 DIAGNOSIS — E559 Vitamin D deficiency, unspecified: Secondary | ICD-10-CM

## 2023-12-09 MED ORDER — VORTIOXETINE HBR 20 MG PO TABS: ORAL_TABLET | ORAL | 2 refills | Status: DC

## 2023-12-09 NOTE — Progress Notes (Signed)
 BH MD Outpatient Follow Up Note  Patient Identification: [name] MRN:  [id number] Date of Evaluation:  12/09/2023  Assessment:  [name] is an established patient presenting for follow-up video conferencing appointment.  Today, 12/09/23, patient reports significant social stressors including husband having an epilepsy attack and significant work stress.  This coincides with the past month having chest pain/heaviness sensation that improved slightly with applying pressure but is fairly constant throughout the day and can worsen with stress.  PCP is aware of this and recommended utilizing a blood pressure cuff for home reads.  There is a chance that this is from Cymbalta though does not match up with titration occurring 2 months ago.  This is particularly unfortunate if it is from Cymbalta as this is the 1 medication that appears to have been improving patient's depression though still has not gotten out of a treatment refractory state.  We will therefore trial Trintellix given the number of medication trials in the past as outlined in plan below.  She was hesitant to try lamotrigine given her husband's epilepsy but reviewed that this is not a medication that has a high probability of inducing a seizure in a patient without a seizure condition.  There are worrying trends that disordered eating is returning as she has begun intermittent fasting again and working out nearly all days of the week.  Strongly encouraged urged getting a nutrition referral from her PCP as lunch meal is still fairly minimal with more regular size dinner meal along with drinking 1 gallon of water per day. As a sign of caffeine is impact on the body, taking magnesium at night as improved sleep and restless legs; her Coke 0 intake is now consistently 1/day and no longer dinnertime.    She will contact insurer for therapist that is in network and may also look into [facility] for marriage counseling.  Follow-up in  1 month.  For safety, her acute risk factors for suicide are: Treatment resistant depression.  Her chronic risk factors for suicide are: Chronic mental illness, access to unsecured firearms.  Her protective factors are: Minor children living in the home, beloved pets, employed, married, actively seeking and engaging with mental health care, over the future, religious probation against suicide, no suicidal ideation in session today.  While future events cannot be fully predicted, she does not currently meet IVC criteria and can be continued as an outpatient.  Identifying Information: [name] is a 43 y.o. female with a history of treatment resistant depression, caffeine overuse with caffeine induced insomnia, generalized anxiety disorder, chronic fatigue with vitamin d deficiency and vitamin b12 deficiency with macrocytosis, history of anorexia nervosa in unclear remission, meralgia paresthetica with recent weight gain, history of alcohol use disorder in sustained remission, history of tobacco use disorder in sustained remission who presented to [facility] via video conferencing for initial evaluation of depression on 09/11/2023; please see that note for full case formulation. No worsening of mood with discontinuation of Wellbutrin and was less anxious and sleeping slightly better.  Plan:  # Treatment resistant depression, recurrent Past medication trials: See medication trials below Status of problem: Not improving as expected Interventions: -- Cross-taper Cymbalta to 60 mg once daily for 1 week then discontinue (i12/26/24, i1/27/25, d3/25/25, dc4/1/25) -- Start Trintellix 10 mg once daily for 1 week then increase to 20 mg thereafter (s3/25/25, i4/1/25) -- Patient to find a therapist that is in network  # Generalized anxiety disorder Past medication  trials:  Status of problem: Chronic with mild exacerbation Interventions: -- Cymbalta, psychotherapy as  above --Patient to cut back on caffeine use  # Caffeine induced insomnia with restless legs Past medication trials:  Status of problem: Improving Interventions: -- Patient to cut back on caffeine use -- continue magnesium nightly  # History of anorexia in unclear remission with vitamin D and B12 deficiency with macrocytosis Past medication trials:  Status of problem: Chronic and stable Interventions: -- Continue vitamin D, B12 supplementation per PCP -- Coordinate with PCP for nutrition referral  # Meralgia paresthetica  left foot tendinitis Past medication trials:  Status of problem: Chronic and stable Interventions: -- Continue with PT and orthopedics  # Elevated blood pressure without diagnosis of hypertension rule out side effects from Cymbalta Past medication trials:  Status of problem: New to provider Interventions: -- Home blood pressure Reids per PCP --Switch Cymbalta for Trintellix as above  # History of alcohol use disorder in early remission Past medication trials:  Status of problem: In remission Interventions: -- Continue to monitor for recurrence and encourage abstinence  # History of tobacco use disorder in sustained remission Past medication trials:  Status of problem: In remission Interventions: -- Continue to encourage abstinence  Patient was given contact information for behavioral health clinic and was instructed to call 911 for emergencies.   Subjective:  Chief Complaint:  No chief complaint on file.   History of Present Illness: Things have been overall the same. Will see rheumatology on April 7th to recheck levels. Does think she is feeling better in terms in depression and ok with doses of medication. Has continued supplements including vitamin d and b12 injections. Blood pressure has been consistently high over the last several months and will be using at home reads. Reviewed possible side effect from cymbalta as she is still working out and  trying to maintain a healthy diet. Is still in high stress situations consistently and does think handling them well has been a sign of improving depression. Husband has epilepsy and had a seizure last week which was very stressful as it happened in front of the children as well. Has noticed a tightness in her chest recently that is the same sensation while awake with the exception of slightly worse when more stressed, and present for one month. Decreased pain slightly with pressure applied, PCP is aware of this and suggested blood pressure cuff. Has also been able to decrease caffeine and primarily doing caffeine free coke zero but still one caffeinated one per day. Still interrupted sleep with magnesium but feeling more fatigued with night dosing but is waking at 430a to work out; bed time typically 9p-10p. Water intake still 1 gallon per day. Has resumed intermittent fasting with nothing between 8p-12p. Lunch is typically cheese crackers, yogurt with granola/protein powder, or a nut with carrots. Dinner typically full plate. Encouraged getting nutrition referral but still hesitant to do this with stressors of weight number and potential changes to her diet. Denies SI.    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 Medication trials: cymbalta (ineffective when combined with Wellbutrin but partially effective without Wellbutrin and unfortunately caused possible hypertension), bupropion (ineffective), buspar (ineffective), fluoxetine (ineffective),  vilazodone (doesn't remember), effexor XR (ineffective), clonazepam,  xanax, lexapro (ineffective), celexa (ineffective), zoloft (ineffective), abilify (ineffective) Previous psychiatrist/therapist: yes to both Hospitalizations: 1 night for depression in 2006 Suicide attempts: none SIB: none Hx of violence towards others: none Current access to guns: yes, unsecured and knows how to access Hx of trauma/abuse: none Substance use: Alcohol is 1-2x per month with at most 3 glasses of wine at a time, not full. No blackouts or physical illness.  Past Medical History:  Past Medical History:  Diagnosis Date   Contraception management 10/07/2013   Mental disorder    depression    Family Psychiatric History: father with depression  Social History:   Academic/Vocational: Research scientist (medical)  Social History   Socioeconomic History   Marital status: Married    Spouse name: Not on file   Number of children: 2   Years of education: Not on file   Highest education level: Not on file  Occupational History   Not on file  Tobacco Use   Smoking status: Former    Types: Cigarettes   Smokeless tobacco: Never   Tobacco comments:    Quit in early 20s  Vaping Use   Vaping status: Never Used  Substance and Sexual Activity   Alcohol use: Yes    Comment: See psychiatry note from 09/11/2023   Drug use: Never   Sexual activity: Yes    Birth control/protection: Surgical    Comment: vasectomy  Other Topics Concern   Not on file  Social History Narrative   Not on file   Social Drivers of Health   Financial Resource Strain: Low Risk  (10/08/2021)   Overall Financial Resource Strain (CARDIA)    Difficulty of Paying Living Expenses: Not hard at all  Food Insecurity: No Food Insecurity (10/08/2021)   Hunger Vital Sign    Worried About Running Out of Food in the Last Year: Never true    Ran Out of Food in the Last Year: Never true  Transportation Needs: No Transportation Needs (10/08/2021)   PRAPARE - Administrator, Civil Service (Medical): No    Lack of  Transportation (Non-Medical): No  Physical Activity: Sufficiently Active (10/08/2021)   Exercise Vital Sign    Days of Exercise per Week: 5 days    Minutes of Exercise per Session: 60 min  Stress: Stress Concern Present (10/08/2021)   [employer] of Occupational Health - Occupational Stress Questionnaire    Feeling of Stress : Very much  Social Connections: Socially Integrated (10/08/2021)   Social Connection and Isolation Panel [NHANES]    Frequency of Communication with Friends and Family: Three times a week    Frequency of Social Gatherings with Friends and Family: Once a week    Attends Religious Services: More than 4 times per year    Active Member of [employer] or Organizations: Yes    Attends Engineer, structural: More than 4 times per year    Marital Status: Married    Additional Social History: updated  Allergies:  No Known Allergies  Current Medications: Current Outpatient Medications  Medication Sig Dispense Refill   BIOTIN PO Take by mouth daily.     Cyanocobalamin (VITAMIN B-12 IJ) Inject as directed.     DULoxetine (CYMBALTA) 60 MG capsule Take 2 capsules (120 mg total) by mouth daily. 60 capsule 2   MAGNESIUM GLUCONATE PO Take by mouth at bedtime.     VITAMIN D PO Take by mouth.     No current facility-administered  medications for this visit.    ROS: Review of Systems  Constitutional:  Positive for appetite change, fatigue and unexpected weight change.  Gastrointestinal:  Negative for constipation, diarrhea, nausea and vomiting.  Endocrine: Positive for heat intolerance. Negative for cold intolerance and polyphagia.  Musculoskeletal:  Positive for myalgias. Negative for arthralgias and back pain.  Skin:        No hair loss  Neurological:  Negative for dizziness and headaches.  Psychiatric/Behavioral:  Positive for dysphoric mood and sleep disturbance. Negative for decreased concentration, hallucinations, self-injury and suicidal ideas. The patient is  nervous/anxious.     Objective:  Psychiatric Specialty Exam: There were no vitals taken for this visit.There is no height or weight on file to calculate BMI.  General Appearance: Casual, Fairly Groomed, and wearing glasses.  Appears stated age  Eye Contact:  Fair  Speech:  Clear and Coherent and Normal Rate  Volume:  Normal  Mood:   "Stressed but handling it better than I would have in the past"  Affect:  Appropriate, Congruent, Depressed, and reactive; brighter than initial appointment but lower energy than last appointment  Thought Content: Logical, Hallucinations: None, and Rumination on weight and body habitus  Suicidal Thoughts:  No  Homicidal Thoughts:  No  Thought Process:  Descriptions of Associations: Tangential but improving linearity  Orientation:  Full (Time, Place, and Person)    Memory: Grossly intact   Judgment:  Fair  Insight:  Fair  Concentration:  Concentration: Fair and Attention Span: Fair  Recall:  not formally assessed   Fund of Knowledge: Good  Language: Fair  Psychomotor Activity:  Normal  Akathisia:  No  AIMS (if indicated): not done  Assets:  Communication Skills Desire for Improvement Financial Resources/Insurance Housing Intimacy Leisure Time Physical Health Resilience Social Support Talents/Skills Transportation Vocational/Educational  ADL's:  Intact  Cognition: WNL  Sleep:  Poor    PE: General: sits comfortably in view of camera; no acute distress  Pulm: no increased work of breathing on room air  MSK: all extremity movements appear intact  Neuro: no focal neurological deficits observed  Gait & Station: unable to assess by video    Metabolic Disorder Labs: No results found for: "HGBA1C", "MPG" No results found for: "PROLACTIN" No results found for: "CHOL", "TRIG", "HDL", "CHOLHDL", "VLDL", "LDLCALC" Lab Results  Component Value Date   TSH 2.400 06/09/2023    Therapeutic Level Labs: No results found for: "LITHIUM" No results  found for: "CBMZ" No results found for: "VALPROATE"  Screenings:  GAD-7    Flowsheet Row Office Visit from 08/26/2023 in [facility] for [facility] at [facility] Office Visit from 06/09/2023 in [facility] for [facility] at [facility] Office Visit from 10[facility]08/2021 in [facility] for [facility] at [facility] Office Visit from 07/10/2020 in [facility] for [facility] at [facility]  Total GAD-7 Score 1 21 14 2       PHQ2-9    Flowsheet Row Office Visit from 09/11/2023 in [facility] at [place] Office Visit from 08/26/2023 in [facility] for [facility] at [facility] Office Visit from 06/09/2023 in [facility] for [facility] at [facility] Office Visit from 10/08/2021 in [facility] for [facility] at [facility] Office Visit from 07/10/2020 in [facility]  PHQ-2 Total Score 6 2 6 6  0  PHQ-9 Total Score 17 6 17 13  2  Flowsheet Row Office Visit from 09/11/2023 in [facility] at [place]  C-SSRS RISK CATEGORY No Risk       Collaboration of Care: Collaboration of Care: Medication Management AEB as above, Primary Care Provider AEB as above, and Referral or follow-up with counselor/therapist AEB as above  Patient/Guardian was advised Release of Information must be obtained prior to any record release in order to collaborate their care with an outside provider. Patient/Guardian was advised if they have not already done so to contact the registration department to sign all necessary forms in order for [place] to release information regarding their care.   Consent: Patient/Guardian gives verbal consent for treatment and assignment of benefits for services provided during this visit. Patient/Guardian expressed understanding and agreed to proceed.   Televisit via video: I connected with  [name] on 12/09/23 at  3:30 PM EDT by a video enabled telemedicine application and verified that I am speaking with the correct person using two identifiers.  Location: Patient: home in [place] Provider: home office   I discussed the limitations of evaluation and management by telemedicine and the availability of in person appointments. The patient expressed understanding and agreed to proceed.  I discussed the assessment and treatment plan with the patient. The patient was provided an opportunity to ask questions and all were answered. The patient agreed with the plan and demonstrated an understanding of the instructions.   The patient was advised to call back or seek an in-person evaluation if the symptoms worsen or if the condition fails to improve as anticipated.  I provided 30 minutes dedicated to the care of this patient via video on the date of this encounter to include chart review, face-to-face time with the patient, medication management/counseling, coordination of care with primary care provider.  Elsie Lincoln, MD 3/25/20253:31 PM

## 2023-12-09 NOTE — Patient Instructions (Signed)
 We decreased Cymbalta to 60 mg once daily for 1 week once you have been able to obtain the Trintellix.  While on the lower dose of Cymbalta for 1 week start half a tablet (10 mg) of Trintellix once daily for 1 week.  Then discontinue the Cymbalta and increase the Trintellix to a full tablet (20 mg) daily thereafter.  This will likely require a prior authorization so wait until this has been completed and we know what this medication is going to cost before decreasing the dose of Cymbalta.

## 2023-12-10 ENCOUNTER — Telehealth (HOSPITAL_COMMUNITY): Payer: Self-pay

## 2023-12-10 NOTE — Telephone Encounter (Signed)
 [place] authorization for pt's Trintellix 20 MG TAB approved from 12/10/23 to 12/09/24.

## 2024-01-13 ENCOUNTER — Telehealth (HOSPITAL_COMMUNITY): Admitting: Psychiatry

## 2024-01-13 ENCOUNTER — Encounter (HOSPITAL_COMMUNITY): Payer: Self-pay | Admitting: Psychiatry

## 2024-01-13 DIAGNOSIS — E559 Vitamin D deficiency, unspecified: Secondary | ICD-10-CM | POA: Diagnosis not present

## 2024-01-13 DIAGNOSIS — E538 Deficiency of other specified B group vitamins: Secondary | ICD-10-CM

## 2024-01-13 DIAGNOSIS — Z8659 Personal history of other mental and behavioral disorders: Secondary | ICD-10-CM | POA: Diagnosis not present

## 2024-01-13 DIAGNOSIS — F339 Major depressive disorder, recurrent, unspecified: Secondary | ICD-10-CM | POA: Diagnosis not present

## 2024-01-13 DIAGNOSIS — F411 Generalized anxiety disorder: Secondary | ICD-10-CM

## 2024-01-13 MED ORDER — VORTIOXETINE HBR 20 MG PO TABS: 20.0000 mg | ORAL_TABLET | Freq: Every day | ORAL | 5 refills | Status: AC

## 2024-01-13 NOTE — Progress Notes (Signed)
 BH MD Outpatient Follow Up Note  Patient Identification: [name] MRN:  [id number] Date of Evaluation:  01/13/2024  Assessment:  RIEN MCGURL is an established patient presenting for follow-up video conferencing appointment.  Today, 01/13/24, patient reports being able to tolerate the switch from Cymbalta  to Trintellix  with the exception of having much loose or bowel movements of up to 5/day for the first week with the switch and is now on the order of 1 to 3/day which is still abnormal.  Likely a combination of discontinuing and norepinephrine agent along with rapid titration of Trintellix .  Trending towards normal so we will maintain 20 mg dose for now as anxiety has been much more manageable and depression has not worsened with the change showing signs of efficacy.  Thankfully the chest pain/heaviness sensation improved after discontinuing Cymbalta  and she now has normal blood pressures again.  As before, she was hesitant to try lamotrigine given her husband's epilepsy but reviewed that this is not a medication that has a high probability of inducing a seizure in a patient without a seizure condition and could still be an option if needing adjunctive treatment in the future.  As previously discussed, there are worrying trends that disordered eating is returning as she has begun intermittent fasting again and working out nearly all days of the week.  Strongly encouraged urged getting a nutrition referral from her PCP as lunch meal is still fairly minimal with more regular size dinner meal along with drinking 1 gallon of water per day. As a sign of caffeine is impact on the body, taking magnesium at night as improved sleep and restless legs; her Coke 0 intake is now consistently 1/day and no longer dinnertime.    She will contact insurer for therapist that is in network and may also look into [facility] for marriage counseling.  No follow-up planned due to provider transition.  For  safety, her acute risk factors for suicide are: Treatment resistant depression.  Her chronic risk factors for suicide are: Chronic mental illness, access to unsecured firearms.  Her protective factors are: Minor children living in the home, beloved pets, employed, married, actively seeking and engaging with mental health care, over the future, religious probation against suicide, no suicidal ideation in session today.  While future events cannot be fully predicted, she does not currently meet IVC criteria and can be continued as an outpatient.  Identifying Information: [name] is a 43 y.o. female with a history of treatment resistant depression, caffeine overuse with caffeine induced insomnia, generalized anxiety disorder, chronic fatigue with vitamin d deficiency and vitamin b12 deficiency with macrocytosis, history of anorexia nervosa in unclear remission, meralgia paresthetica with recent weight gain, history of alcohol use disorder in sustained remission, history of tobacco use disorder in sustained remission who presented to [facility] via video conferencing for initial evaluation of depression on 09/11/2023; please see that note for full case formulation. No worsening of mood with discontinuation of Wellbutrin  and was less anxious and sleeping slightly better.  Plan:  # Treatment resistant depression, recurrent Past medication trials: See medication trials below Status of problem: chronic and stable Interventions: -- Continue Trintellix  20 mg daily (s3/25/25, i4/1/25) -- Patient to find a therapist that is in network  # Generalized anxiety disorder Past medication trials:  Status of problem: improving Interventions: -- trintellix , psychotherapy as above --Patient to cut back on caffeine use  # Psychophysiologic insomnia with restless legs Past medication trials:  Status  of problem: Improving Interventions: -- Patient to cut back on caffeine use --  continue magnesium nightly  # History of anorexia in unclear remission with vitamin D and B12 deficiency with macrocytosis Past medication trials:  Status of problem: Chronic and stable Interventions: -- Continue vitamin D, B12 supplementation per PCP -- Coordinate with PCP for nutrition referral  # Meralgia paresthetica  left foot tendinitis Past medication trials:  Status of problem: Chronic and stable Interventions: -- Continue with PT and orthopedics  # History of alcohol use disorder in early remission Past medication trials:  Status of problem: In remission Interventions: -- Continue to monitor for recurrence and encourage abstinence  # History of tobacco use disorder in sustained remission Past medication trials:  Status of problem: In remission Interventions: -- Continue to encourage abstinence  Patient was given contact information for behavioral health clinic and was instructed to call 911 for emergencies.   Subjective:  Chief Complaint:  Chief Complaint  Patient presents with   Depression   Anxiety   Follow-up   Stress   Eating Disorder    History of Present Illness: Things have been overall well since last appointment. Was able to make the transition from cymbalta  to trintellix . Only side effect was looser bowel movements. Blood pressure finally normalized with coming off the cymbalta . Hasn't felt any worse depression wise with the switch from cymbalta . Anxiety has been pretty manageable at this point and the chest pain has resolved. Caffeine still primarily doing caffeine free coke zero but still one caffeinated one per day. Still interrupted sleep with magnesium but feeling more fatigued with night dosing but is still waking at 430a to work out; bed time typically 9p-10p. Water intake still 1 gallon per day. Has resumed intermittent fasting with nothing between 8p-12p. Lunch is still typically cheese crackers, yogurt with granola/protein powder, or a nut with  carrots. Dinner typically full plate. Encouraged getting nutrition referral but still hesitant to do this with stressors of weight number and potential changes to her diet. Rheumatology appointment went well and repeat testing was reassuring. Denies SI.    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 Medication trials: cymbalta  (ineffective when combined with Wellbutrin  but partially effective without Wellbutrin  and unfortunately caused hypertension), bupropion  (ineffective), buspar  (ineffective), fluoxetine (ineffective), vilazodone (doesn't remember), effexor  XR (ineffective), clonazepam, xanax, lexapro (ineffective), celexa (ineffective), zoloft (ineffective), abilify (ineffective), Trintellix  (effective) Previous psychiatrist/therapist: yes to both Hospitalizations: 1 night for depression in 2006 Suicide attempts: none SIB: none Hx of violence towards others: none Current access to guns: yes, unsecured and knows how to access Hx of trauma/abuse: none Substance use: Alcohol is 1-2x per month with at most 3 glasses of wine at a time, not full. No blackouts or physical illness.  Past Medical History:  Past Medical History:  Diagnosis Date   Caffeine overuse 09/11/2023   Contraception management 10/07/2013   Mental disorder    depression    Family Psychiatric History: father with depression  Social History:   Academic/Vocational: Research scientist (medical)  Social History   Socioeconomic History   Marital status: Married    Spouse name: Not on file   Number of children: 2   Years of education: Not on  file    Highest education level: Not on file  Occupational History   Not on file  Tobacco Use   Smoking status: Former    Types: Cigarettes   Smokeless tobacco: Never   Tobacco comments:    Quit in early 18s  Vaping Use   Vaping status: Never Used  Substance and Sexual Activity   Alcohol use: Yes    Comment: See psychiatry note from 09/11/2023   Drug use: Never   Sexual activity: Yes    Birth control/protection: Surgical    Comment: vasectomy  Other Topics Concern   Not on file  Social History Narrative   Not on file   Social Drivers of Health   Financial Resource Strain: Low Risk  (10/08/2021)   Overall Financial Resource Strain (CARDIA)    Difficulty of Paying Living Expenses: Not hard at all  Food Insecurity: No Food Insecurity (10/08/2021)   Hunger Vital Sign    Worried About Running Out of Food in the Last Year: Never true    Ran Out of Food in the Last Year: Never true  Transportation Needs: No Transportation Needs (10/08/2021)   PRAPARE - Administrator, Civil Service (Medical): No    Lack of Transportation (Non-Medical): No  Physical Activity: Sufficiently Active (10/08/2021)   Exercise Vital Sign    Days of Exercise per Week: 5 days    Minutes of Exercise per Session: 60 min  Stress: Stress Concern Present (10/08/2021)   [employer] of Occupational Health - Occupational Stress Questionnaire    Feeling of Stress : Very much  Social Connections: Socially Integrated (10/08/2021)   Social Connection and Isolation Panel [NHANES]    Frequency of Communication with Friends and Family: Three times a week    Frequency of Social Gatherings with Friends and Family: Once a week    Attends Religious Services: More than 4 times per year    Active Member of [employer] or Organizations: Yes    Attends Engineer, structural: More than 4 times per year    Marital Status: Married    Additional Social History: updated  Allergies:  No Known Allergies  Current  Medications: Current Outpatient Medications  Medication Sig Dispense Refill   TURMERIC PO Take by mouth daily.     BIOTIN PO Take by mouth daily.     Cyanocobalamin (VITAMIN B-12 IJ) Inject as directed.     MAGNESIUM GLUCONATE PO Take by mouth at bedtime.     VITAMIN D PO Take by mouth.     vortioxetine  HBr (TRINTELLIX ) 20 MG TABS tablet Take 1 tablet (20 mg total) by mouth daily. 30 tablet 5   No current facility-administered medications for this visit.    ROS: Review of Systems  Constitutional:  Positive for appetite change and fatigue. Negative for unexpected weight change.  Gastrointestinal:  Positive for diarrhea. Negative for constipation, nausea and vomiting.  Endocrine: Positive for heat intolerance. Negative for cold intolerance and polyphagia.  Musculoskeletal:  Positive for myalgias. Negative for arthralgias and back pain.  Skin:        No hair loss  Neurological:  Negative for dizziness and headaches.  Psychiatric/Behavioral:  Positive for dysphoric mood and sleep disturbance. Negative for decreased concentration, hallucinations, self-injury and suicidal ideas. The patient is not nervous/anxious.     Objective:  Psychiatric Specialty Exam: There were no vitals taken for this visit.There is no height or weight on file to calculate BMI.  General Appearance: Casual, Fairly Groomed, and  wearing glasses.  Appears stated age  Eye Contact:  Fair  Speech:  Clear and Coherent and Normal Rate  Volume:  Normal  Mood:   "Fairly well"  Affect:  Appropriate, Congruent, Depressed, and reactive; brighter than initial appointment  Thought Content: Logical, Hallucinations: None, and Rumination on weight and body habitus  Suicidal Thoughts:  No  Homicidal Thoughts:  No  Thought Process:  Descriptions of Associations: Tangential but improving linearity  Orientation:  Full (Time, Place, and Person)    Memory: Grossly intact   Judgment:  Fair  Insight:  Fair  Concentration:   Concentration: Fair and Attention Span: Fair  Recall:  not formally assessed   Fund of Knowledge: Good  Language: Fair  Psychomotor Activity:  Normal  Akathisia:  No  AIMS (if indicated): not done  Assets:  Communication Skills Desire for Improvement Financial Resources/Insurance Housing Intimacy Leisure Time Physical Health Resilience Social Support Talents/Skills Transportation Vocational/Educational  ADL's:  Intact  Cognition: WNL  Sleep:  Poor but stable   PE: General: sits comfortably in view of camera; no acute distress  Pulm: no increased work of breathing on room air  MSK: all extremity movements appear intact  Neuro: no focal neurological deficits observed  Gait & Station: unable to assess by video    Metabolic Disorder Labs: No results found for: "HGBA1C", "MPG" No results found for: "PROLACTIN" No results found for: "CHOL", "TRIG", "HDL", "CHOLHDL", "VLDL", "LDLCALC" Lab Results  Component Value Date   TSH 2.400 06/09/2023    Therapeutic Level Labs: No results found for: "LITHIUM" No results found for: "CBMZ" No results found for: "VALPROATE"  Screenings:  GAD-7    Flowsheet Row Office Visit from 08/26/2023 in [facility] at [facility] Office Visit from 06/09/2023 in [facility] for [facility] at [facility] Office Visit from 10/08/2021 in [facility] for [facility] at [facility] Office Visit from 07/10/2020 in [facility] at [facility]  Total GAD-7 Score 1 21 14 2       PHQ2-9    Flowsheet Row Office Visit from 09/11/2023 in [facility] at [place] Office Visit from 08/26/2023 in [facility] for [facility] at [facility] Office Visit from 06/09/2023 in [facility] for [facility] at [facility] Office Visit from 10/08/2021 in [facility] for [facility] at [facility] Office Visit from  07/10/2020 in [facility] at [facility]  PHQ-2 Total Score 6 2 6 6  0  PHQ-9 Total Score 17 6 17 13 2       Flowsheet Row Office Visit from 09/11/2023 in [facility] at [place]  C-SSRS RISK CATEGORY No Risk       Collaboration of Care: Collaboration of Care: Medication Management AEB as above, Primary Care Provider AEB as above, and Referral or follow-up with counselor/therapist AEB as above  Patient/Guardian was advised Release of Information must be obtained prior to any record release in order to collaborate their care with an outside provider. Patient/Guardian was advised if they have not already done so to contact the registration department to sign all necessary forms in order for us  to release information regarding their care.   Consent: Patient/Guardian gives verbal consent for treatment and assignment of benefits for services provided during this visit. Patient/Guardian expressed understanding and agreed to proceed.   Televisit via video: I connected with [name] on  01/13/24 at  3:30 PM EDT by a video enabled telemedicine application and verified that I am speaking with the correct person using two identifiers.  Location: Patient: home in [place] Provider: home office   I discussed the limitations of evaluation and management by telemedicine and the availability of in person appointments. The patient expressed understanding and agreed to proceed.  I discussed the assessment and treatment plan with the patient. The patient was provided an opportunity to ask questions and all were answered. The patient agreed with the plan and demonstrated an understanding of the instructions.   The patient was advised to call back or seek an in-person evaluation if the symptoms worsen or if the condition fails to improve as anticipated.  I provided 20 minutes dedicated to the care of this patient via video on the date of this  encounter to include chart review, face-to-face time with the patient, medication management/counseling, coordination of care with primary care provider.  Madie Schilling, MD 4/29/20254:01 PM

## 2024-01-13 NOTE — Patient Instructions (Signed)
 We did not make any medication changes today.  You continue to consider getting a nutrition referral from your primary care provider and a possible when this happens try to get one that has familiarity with disordered eating to get the most effective recommendations.
